# Patient Record
Sex: Female | Born: 1965 | Race: White | Hispanic: No | Smoking: Never smoker
Health system: Southern US, Community
[De-identification: ages and names within clinical notes are randomized; demographics above are authoritative.]

## PROBLEM LIST (undated history)

## (undated) DIAGNOSIS — K219 Gastro-esophageal reflux disease without esophagitis: Secondary | ICD-10-CM

## (undated) DIAGNOSIS — G4700 Insomnia, unspecified: Secondary | ICD-10-CM

## (undated) DIAGNOSIS — Z780 Asymptomatic menopausal state: Secondary | ICD-10-CM

## (undated) DIAGNOSIS — F419 Anxiety disorder, unspecified: Secondary | ICD-10-CM

## (undated) DIAGNOSIS — F329 Major depressive disorder, single episode, unspecified: Secondary | ICD-10-CM

## (undated) DIAGNOSIS — F32A Depression, unspecified: Secondary | ICD-10-CM

## (undated) DIAGNOSIS — I10 Essential (primary) hypertension: Secondary | ICD-10-CM

## (undated) DIAGNOSIS — E7800 Pure hypercholesterolemia, unspecified: Secondary | ICD-10-CM

## (undated) HISTORY — DX: Asymptomatic menopausal state: Z78.0

## (undated) HISTORY — PX: SHOULDER OPEN ROTATOR CUFF REPAIR: SHX2407

## (undated) HISTORY — PX: BREAST SURGERY: SHX581

## (undated) HISTORY — PX: OTHER SURGICAL HISTORY: SHX169

## (undated) HISTORY — PX: RHINOPLASTY: SUR1284

## (undated) HISTORY — DX: Essential (primary) hypertension: I10

## (undated) HISTORY — DX: Pure hypercholesterolemia, unspecified: E78.00

---

## 1996-10-23 HISTORY — PX: AUGMENTATION MAMMAPLASTY: SUR837

## 2016-10-02 DIAGNOSIS — Z760 Encounter for issue of repeat prescription: Secondary | ICD-10-CM | POA: Diagnosis not present

## 2016-10-21 ENCOUNTER — Encounter: Payer: Self-pay | Admitting: Emergency Medicine

## 2016-10-21 ENCOUNTER — Emergency Department
Admission: EM | Admit: 2016-10-21 | Discharge: 2016-10-21 | Disposition: A | Discharge status: Home / Self Care | Payer: 59 | Source: Home / Self Care | Attending: Family Medicine | Admitting: Family Medicine

## 2016-10-21 DIAGNOSIS — Z760 Encounter for issue of repeat prescription: Secondary | ICD-10-CM | POA: Diagnosis not present

## 2016-10-21 DIAGNOSIS — R69 Illness, unspecified: Secondary | ICD-10-CM | POA: Diagnosis not present

## 2016-10-21 DIAGNOSIS — R509 Fever, unspecified: Secondary | ICD-10-CM | POA: Diagnosis not present

## 2016-10-21 DIAGNOSIS — R05 Cough: Secondary | ICD-10-CM

## 2016-10-21 DIAGNOSIS — J111 Influenza due to unidentified influenza virus with other respiratory manifestations: Secondary | ICD-10-CM

## 2016-10-21 HISTORY — DX: Insomnia, unspecified: G47.00

## 2016-10-21 HISTORY — DX: Gastro-esophageal reflux disease without esophagitis: K21.9

## 2016-10-21 HISTORY — DX: Major depressive disorder, single episode, unspecified: F32.9

## 2016-10-21 HISTORY — DX: Depression, unspecified: F32.A

## 2016-10-21 HISTORY — DX: Anxiety disorder, unspecified: F41.9

## 2016-10-21 MED ORDER — PROMETHAZINE-CODEINE 6.25-10 MG/5ML PO SYRP: 5.0000 mL | ORAL_SOLUTION | Freq: Four times a day (QID) | ORAL | 0 refills | Status: DC | PRN

## 2016-10-21 MED ORDER — BENZONATATE 100 MG PO CAPS: 100.0000 mg | ORAL_CAPSULE | Freq: Three times a day (TID) | ORAL | 0 refills | Status: DC

## 2016-10-21 MED ORDER — OSELTAMIVIR PHOSPHATE 75 MG PO CAPS: 75.0000 mg | ORAL_CAPSULE | Freq: Two times a day (BID) | ORAL | 0 refills | Status: DC

## 2016-10-21 NOTE — ED Provider Notes (Signed)
CSN: [id number]     Arrival date & time 10/21/16  1010 History   First MD Initiated Contact with Patient 10/21/16 1058     Chief Complaint  Patient presents with  . Fever  . Cough  . Generalized Body Aches   (Consider location/radiation/quality/duration/timing/severity/associated sxs/prior Treatment) HPI  Sonya Munoz is a 50 y.o. female presenting to UC with c/o sudden onset cough for 2 days, then body aches, and fever that started last night.  Pt did get the flu vaccine this season as she works as a Marine scientist but notes she feels like she does have the flu as she has had it twice in the past.  Denies chest pain or SOB.  She has been taking tylenol and ibuprofen with mild temporary relief.  Denies n/v/d.  Pt notes she was around a sick relative this past weekend.   Past Medical History:  Diagnosis Date  . Anxiety and depression   . GERD (gastroesophageal reflux disease)   . Insomnia    Past Surgical History:  Procedure Laterality Date  . BREAST SURGERY Bilateral    augmentation  . fallopian tube repair    . RHINOPLASTY    . SHOULDER OPEN ROTATOR CUFF REPAIR Left    History reviewed. No pertinent family history. Social History  Substance Use Topics  . Smoking status: Never Smoker  . Smokeless tobacco: Never Used  . Alcohol use Yes   OB History    No data available     Review of Systems  Constitutional: Positive for fatigue and fever. Negative for chills.  HENT: Positive for congestion. Negative for ear pain, sore throat, trouble swallowing and voice change.   Respiratory: Positive for cough. Negative for shortness of breath.   Cardiovascular: Negative for chest pain and palpitations.  Gastrointestinal: Negative for abdominal pain, diarrhea, nausea and vomiting.  Musculoskeletal: Positive for arthralgias and myalgias. Negative for back pain.  Skin: Negative for rash.    Allergies  Patient has no known allergies.  Home Medications   Prior to Admission medications    Medication Sig Start Date End Date Taking? Authorizing Provider  estrogen, conjugated,-medroxyprogesterone (PREMPRO) 0.3-1.5 MG tablet Take 1 tablet by mouth daily.   Yes Historical Provider, MD  lisinopril (PRINIVIL,ZESTRIL) 10 MG tablet Take 10 mg by mouth daily.   Yes Historical Provider, MD  omeprazole (PRILOSEC) 20 MG capsule Take 20 mg by mouth daily.   Yes Historical Provider, MD  sertraline (ZOLOFT) 50 MG tablet Take 50 mg by mouth daily.   Yes Historical Provider, MD  traZODone (DESYREL) 25 mg TABS tablet Take 25 mg by mouth at bedtime.   Yes Historical Provider, MD  benzonatate (TESSALON) 100 MG capsule Take 1-2 capsules (100-200 mg total) by mouth every 8 (eight) hours. 10/21/16   Noland Fordyce, PA-C  oseltamivir (TAMIFLU) 75 MG capsule Take 1 capsule (75 mg total) by mouth every 12 (twelve) hours. 10/21/16   Noland Fordyce, PA-C  promethazine-codeine (PHENERGAN WITH CODEINE) 6.25-10 MG/5ML syrup Take 5 mLs by mouth every 6 (six) hours as needed for cough. 10/21/16   Noland Fordyce, PA-C   Meds Ordered and Administered this Visit  Medications - No data to display  BP 124/80 (BP Location: Left Arm)   Pulse 85   Temp 99.7 F (37.6 C) (Oral)   Resp 17   Ht 5\' 7"  (1.702 m)   Wt 145 lb (65.8 kg)   SpO2 99%   BMI 22.71 kg/m  No data found.   Physical Exam  Constitutional: She is oriented to person, place, and time. She appears well-developed and well-nourished. No distress.  HENT:  Head: Normocephalic and atraumatic.  Right Ear: Tympanic membrane normal.  Left Ear: Tympanic membrane normal.  Nose: Mucosal edema present.  Mouth/Throat: Uvula is midline and mucous membranes are normal. Posterior oropharyngeal erythema present. No oropharyngeal exudate, posterior oropharyngeal edema or tonsillar abscesses.  Eyes: EOM are normal.  Neck: Normal range of motion. Neck supple.  Cardiovascular: Normal rate and regular rhythm.   Pulmonary/Chest: Effort normal and breath sounds normal.  No stridor. No respiratory distress. She has no wheezes. She has no rales.  Musculoskeletal: Normal range of motion.  Lymphadenopathy:    She has no cervical adenopathy.  Neurological: She is alert and oriented to person, place, and time.  Skin: Skin is warm and dry. She is not diaphoretic.  Psychiatric: She has a normal mood and affect. Her behavior is normal.  Nursing note and vitals reviewed.   Urgent Care Course   Clinical Course     Procedures (including critical care time)  Labs Review Labs Reviewed - No data to display  Imaging Review No results found.   MDM   1. Influenza-like illness    Pt c/o flu-like symptoms for 2 days.  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 Pt requesting Tamiflu and tessalon and cough medication to help her sleep at night  Rx: Tamiflu, tessalon, and promethazine-codeine (advised not to drink alcohol or drive while taking)  Advised pt to use acetaminophen and ibuprofen as needed for fever and pain. Encouraged rest and fluids. F/u with PCP in 7-10 days if not improving, sooner if worsening. Pt verbalized understanding and agreement with tx plan.    Noland Fordyce, PA-C 10/21/16 1350

## 2016-10-21 NOTE — ED Triage Notes (Signed)
Patient started with cough 2 days ago; last night aches and fever.

## 2016-10-31 MED FILL — SERTRALINE HCL 50 MG TABLET: 50 | 30 days supply | Qty: 30 | Fill #0

## 2016-10-31 MED FILL — PREMPRO 0.3 MG-1.5 MG TAB: 0.3-1.5 | 28 days supply | Qty: 28 | Fill #0

## 2016-10-31 MED FILL — traZODone HCL 50 MG TABS: 50 | 30 days supply | Qty: 30 | Fill #0

## 2016-11-03 DIAGNOSIS — I10 Essential (primary) hypertension: Secondary | ICD-10-CM | POA: Diagnosis not present

## 2016-11-03 DIAGNOSIS — N951 Menopausal and female climacteric states: Secondary | ICD-10-CM | POA: Diagnosis not present

## 2016-11-03 DIAGNOSIS — G4709 Other insomnia: Secondary | ICD-10-CM | POA: Diagnosis not present

## 2016-11-03 DIAGNOSIS — F418 Other specified anxiety disorders: Secondary | ICD-10-CM | POA: Diagnosis not present

## 2016-11-03 DIAGNOSIS — K219 Gastro-esophageal reflux disease without esophagitis: Secondary | ICD-10-CM | POA: Diagnosis not present

## 2016-11-03 DIAGNOSIS — L308 Other specified dermatitis: Secondary | ICD-10-CM | POA: Diagnosis not present

## 2016-11-21 MED FILL — LISINOPRIL 10 MG TABLET: 10 | 90 days supply | Qty: 90 | Fill #0

## 2016-11-22 MED FILL — PREMPRO 0.3 MG-1.5 MG TAB: 0.3-1.5 | 28 days supply | Qty: 28 | Fill #0

## 2016-12-04 MED FILL — SERTRALINE HCL 50 MG TABLET: 50 | 30 days supply | Qty: 30 | Fill #1

## 2016-12-25 MED FILL — PREMPRO 0.3 MG-1.5 MG TAB: 0.3-1.5 | 28 days supply | Qty: 28 | Fill #1

## 2016-12-25 MED FILL — FLUOCINONIDE 0.05% SOLUTION: 0.05 | 30 days supply | Qty: 60 | Fill #0

## 2017-01-04 MED FILL — SERTRALINE HCL 50 MG TABLET: 50 | 30 days supply | Qty: 30 | Fill #2

## 2017-01-08 MED FILL — traZODone HCL 50 MG TABS: 50 | 90 days supply | Qty: 90 | Fill #0

## 2017-01-22 MED FILL — PREMPRO 0.3 MG-1.5 MG TAB: 0.3-1.5 | 28 days supply | Qty: 28 | Fill #2

## 2017-02-07 MED FILL — SERTRALINE HCL 50 MG TABLET: 50 | 30 days supply | Qty: 30 | Fill #3

## 2017-02-19 MED FILL — PREMPRO 0.3 MG-1.5 MG TAB: 0.3-1.5 | 28 days supply | Qty: 28 | Fill #3

## 2017-02-19 MED FILL — LISINOPRIL 10 MG TABLET: 10 | 90 days supply | Qty: 90 | Fill #1

## 2017-03-05 MED FILL — SERTRALINE HCL 50 MG TABLET: 50 | 30 days supply | Qty: 30 | Fill #4

## 2017-03-15 MED FILL — PREMPRO 0.3 MG-1.5 MG TAB: 0.3-1.5 | 28 days supply | Qty: 28 | Fill #4

## 2017-04-08 MED FILL — SERTRALINE HCL 50 MG TABLET: 50 | 30 days supply | Qty: 30 | Fill #5

## 2017-04-15 MED FILL — traZODone HCL 50 MG TABS: 50 | 90 days supply | Qty: 90 | Fill #1

## 2017-04-15 MED FILL — PREMPRO 0.3 MG-1.5 MG TAB: 0.3-1.5 | 28 days supply | Qty: 28 | Fill #5

## 2017-05-08 DIAGNOSIS — K219 Gastro-esophageal reflux disease without esophagitis: Secondary | ICD-10-CM | POA: Diagnosis not present

## 2017-05-08 DIAGNOSIS — G4709 Other insomnia: Secondary | ICD-10-CM | POA: Diagnosis not present

## 2017-05-08 DIAGNOSIS — I10 Essential (primary) hypertension: Secondary | ICD-10-CM | POA: Diagnosis not present

## 2017-05-08 DIAGNOSIS — N951 Menopausal and female climacteric states: Secondary | ICD-10-CM | POA: Diagnosis not present

## 2017-05-08 DIAGNOSIS — Z0000 Encounter for general adult medical examination without abnormal findings: Secondary | ICD-10-CM | POA: Diagnosis not present

## 2017-05-08 DIAGNOSIS — Z1211 Encounter for screening for malignant neoplasm of colon: Secondary | ICD-10-CM | POA: Diagnosis not present

## 2017-05-08 DIAGNOSIS — L308 Other specified dermatitis: Secondary | ICD-10-CM | POA: Diagnosis not present

## 2017-05-08 DIAGNOSIS — F418 Other specified anxiety disorders: Secondary | ICD-10-CM | POA: Diagnosis not present

## 2017-05-08 MED FILL — LISINOPRIL 10 MG TABLET: 10 | 90 days supply | Qty: 90 | Fill #0

## 2017-05-11 MED FILL — SERTRALINE HCL 50 MG TABLET: 50 | 30 days supply | Qty: 30 | Fill #6

## 2017-05-11 MED FILL — PREMPRO 0.3 MG-1.5 MG TAB: 0.3-1.5 | 28 days supply | Qty: 28 | Fill #6

## 2017-06-08 MED FILL — SERTRALINE HCL 50 MG TABLET: 50 | 30 days supply | Qty: 30 | Fill #7

## 2017-06-08 MED FILL — PREMPRO 0.3 MG-1.5 MG TAB: 0.3-1.5 | 28 days supply | Qty: 28 | Fill #7

## 2017-07-09 MED FILL — SERTRALINE HCL 50 MG TABLET: 50 | 90 days supply | Qty: 90 | Fill #0

## 2017-07-09 MED FILL — PREMPRO 0.3 MG-1.5 MG TAB: 0.3-1.5 | 28 days supply | Qty: 28 | Fill #8

## 2017-08-05 MED FILL — PREMPRO 0.3 MG-1.5 MG TAB: 0.3-1.5 | 28 days supply | Qty: 28 | Fill #9

## 2017-08-06 MED FILL — LISINOPRIL 10 MG TABS: 10 | 90 days supply | Qty: 90 | Fill #1

## 2017-08-16 MED FILL — traZODone HCL 50 MG TABS: 50 | 90 days supply | Qty: 90 | Fill #0

## 2017-09-01 MED FILL — PREMPRO 0.3 MG-1.5 MG TAB: 0.3-1.5 | 28 days supply | Qty: 28 | Fill #10

## 2017-09-11 ENCOUNTER — Other Ambulatory Visit: Payer: Self-pay | Admitting: Family Medicine

## 2017-09-11 DIAGNOSIS — Z139 Encounter for screening, unspecified: Secondary | ICD-10-CM

## 2017-09-30 MED FILL — FLUOCINONIDE 0.05% SOLUTION: 0.05 | 30 days supply | Qty: 60 | Fill #1

## 2017-09-30 MED FILL — PREMPRO 0.3 MG-1.5 MG TAB: 0.3-1.5 | 28 days supply | Qty: 28 | Fill #11

## 2017-10-09 MED FILL — SERTRALINE HCL 50 MG TABLET: 50 | 90 days supply | Qty: 90 | Fill #0

## 2017-10-11 ENCOUNTER — Ambulatory Visit
Admission: RE | Admit: 2017-10-11 | Discharge: 2017-10-11 | Disposition: A | Discharge status: Home / Self Care | Payer: 59 | Source: Ambulatory Visit | Attending: Family Medicine | Admitting: Family Medicine

## 2017-10-11 DIAGNOSIS — Z1231 Encounter for screening mammogram for malignant neoplasm of breast: Secondary | ICD-10-CM | POA: Diagnosis not present

## 2017-10-11 DIAGNOSIS — Z139 Encounter for screening, unspecified: Secondary | ICD-10-CM

## 2017-10-18 DIAGNOSIS — Z13220 Encounter for screening for lipoid disorders: Secondary | ICD-10-CM | POA: Diagnosis not present

## 2017-10-18 DIAGNOSIS — I10 Essential (primary) hypertension: Secondary | ICD-10-CM | POA: Diagnosis not present

## 2017-10-22 MED FILL — PREMPRO 0.3 MG-1.5 MG TAB: 0.3-1.5 | 84 days supply | Qty: 84 | Fill #0

## 2017-11-13 MED FILL — LISINOPRIL 10 MG TABS: 10 | 90 days supply | Qty: 90 | Fill #0

## 2017-11-22 DIAGNOSIS — F418 Other specified anxiety disorders: Secondary | ICD-10-CM | POA: Diagnosis not present

## 2017-11-22 DIAGNOSIS — I10 Essential (primary) hypertension: Secondary | ICD-10-CM | POA: Diagnosis not present

## 2017-11-22 DIAGNOSIS — G4709 Other insomnia: Secondary | ICD-10-CM | POA: Diagnosis not present

## 2017-11-22 DIAGNOSIS — N951 Menopausal and female climacteric states: Secondary | ICD-10-CM | POA: Diagnosis not present

## 2017-12-20 ENCOUNTER — Ambulatory Visit: Payer: 59 | Admitting: Family Medicine

## 2017-12-27 ENCOUNTER — Ambulatory Visit: Payer: Self-pay

## 2017-12-27 ENCOUNTER — Encounter: Payer: Self-pay | Admitting: Family Medicine

## 2017-12-27 ENCOUNTER — Ambulatory Visit (INDEPENDENT_AMBULATORY_CARE_PROVIDER_SITE_OTHER): Payer: 59 | Admitting: Family Medicine

## 2017-12-27 VITALS — BP 132/74 | Ht 67.0 in | Wt 139.0 lb

## 2017-12-27 DIAGNOSIS — M25511 Pain in right shoulder: Secondary | ICD-10-CM | POA: Insufficient documentation

## 2017-12-27 NOTE — Patient Instructions (Signed)
You have impingement of your infraspinatus with overlying bursitis. Avoid full external rotation, overhead presses, incline bench, full motion on flys. Avoid other painful activities as well. Aleve 2 tabs twice a day with food OR ibuprofen 3 tabs three times a day with food for pain and inflammation - typically for 7-10 days then as needed. Can take tylenol in addition to this if needed. Injection may be beneficial to help with pain and to decrease inflammation if struggling. Consider physical therapy with transition to home exercise program. Do home exercise program with theraband and scapular stabilization exercises daily 3 sets of 10 once a day. If not improving at follow-up we will consider injection, physical therapy, and/or nitro patches. Follow up with me in 6 weeks for reevaluation.

## 2017-12-27 NOTE — Assessment & Plan Note (Addendum)
Acute.  Signs of infraspinatus bursitis based on ultrasound.  Likely from repetitive use.  Difficult to localize tenderness based on exam.  No signs of rotator cuff though MRI would be optimal for labral tear evaluation.  - Recommending rotator cuff strengthening, Aleve BID PRN for pain - RTC 6 weeks

## 2017-12-27 NOTE — Progress Notes (Signed)
Subjective   Patient ID: Sonya Munoz    DOB: [birth year], 52 y.o. female   MRN: [id number]  CC: "Right shoulder pain"  HPI: Sonya Munoz is a 52 y.o. female who presents to clinic today for the following:  Right shoulder pain: Patient is a right-handed weightlifter presenting with right shoulder pain over the last 6 weeks.  She states her pain is worse with pulling and she has avoided bench pressing due to exacerbation of her symptoms.  She endorses sleeping on her right side and sometimes has pain when waking up however this improves during the day unless she exercises.  She was also playing football with her nephew and endorsed exacerbation of the pain.  She denies sensory loss or decreased motor function.  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  She is unsure if this knee pain is consistent with her prior labral tear.  She continues to lift but avoids exercises which exacerbate her symptoms and has tried Advil twice daily for [redacted] week along with rest ice and heat.  ROS: see HPI for pertinent.  [place]: Depression and anxiety, GERD, insomnia.  Surgical history left rotator cuff repair.  Family history unremarkable. Smoking status reviewed. Medications reviewed.  Objective   BP 132/74   Ht 5\' 7"  (1.702 m)   Wt 139 lb (63 kg)   BMI 21.77 kg/m  Vitals and nursing note reviewed.  General: well nourished, well developed, NAD with non-toxic appearance HEENT: normocephalic, atraumatic, moist mucous membranes Cardiovascular: regular rate and rhythm without murmurs, rubs, or gallops Lungs: clear to auscultation bilaterally with normal work of breathing Skin: warm, dry, no rashes or lesions, cap refill < 2 seconds Extremities: warm and well perfused, normal tone, no edema MSK: Symmetrical shoulders without atrophy, nontender on AC,  mild tenderness with proximal biceps and deltoid, positive apprehension test, negative Yergason and empty can test, active and passive range of motion intact, neurovascular intact  Complete ultrasound of right rotator cuff: Biceps intact without signs of impingement.  AC joint with arthritic changes.  Infraspinatus bursa with increased fluid.  Supra spinatus, subscapular, and teres minor unremarkable.  Posterior labrum visualized without signs of labral tear.  Assessment & Plan   Right shoulder pain Acute.  Signs of infraspinatus bursitis based on ultrasound.  Likely from repetitive use.  Difficult to localize tenderness based on exam.  No signs of rotator cuff though MRI would be optimal for labral tear evaluation.  - Recommending rotator cuff strengthening, Aleve BID PRN for pain - RTC 6 weeks  Orders Placed This Encounter  Procedures  . [place] COMPLETE JOINT SPACE STRUCTURES UP RIGHT    Standing Status:   Future    Number of Occurrences:   1    Standing Expiration Date:   02/26/2019    Order Specific Question:   Reason for Exam (SYMPTOM  OR DIAGNOSIS REQUIRED)    Answer:   RIGHT SHOULDER PAIN    Order Specific Question:   Preferred imaging location?    Answer:   Internal   No orders of the defined types were placed in this encounter.   Harriet [place], [place], PGY-2 12/27/2017, 5:06 PM   Addendum:  Patient seen with resident and agree with his note.  Patient noted to have pain anterior and lateral shoulder worse with external rotation on exam.  Ultrasound showed no evidence of rotator cuff tear but bursitis overlying infraspinatus and impingement.  Discussed how to modify her workout.  Aleve or ibuprofen regularly.  Shown home exercise program.  Consider injection, PT, nitro patches if not improving.  She will follow up in 6 weeks.

## 2018-01-04 MED FILL — traZODone HCL 50 MG TABS: 50 | 90 days supply | Qty: 90 | Fill #0

## 2018-01-04 MED FILL — SERTRALINE HCL 50 MG TABLET: 50 | 90 days supply | Qty: 90 | Fill #0

## 2018-01-22 MED FILL — PREMPRO 0.3 MG-1.5 MG TAB: 0.3-1.5 | 28 days supply | Qty: 28 | Fill #1

## 2018-02-11 ENCOUNTER — Ambulatory Visit: Payer: 59 | Admitting: Family Medicine

## 2018-02-18 MED FILL — PREMPRO 0.3 MG-1.5 MG TAB: 0.3-1.5 | 28 days supply | Qty: 28 | Fill #2

## 2018-02-18 MED FILL — LISINOPRIL 10 MG TABS: 10 | 90 days supply | Qty: 90 | Fill #0

## 2018-03-19 MED FILL — PREMPRO 0.3 MG-1.5 MG TAB: 0.3-1.5 | 28 days supply | Qty: 28 | Fill #3

## 2018-03-29 ENCOUNTER — Telehealth: Payer: 59 | Admitting: Family

## 2018-03-29 DIAGNOSIS — B9689 Other specified bacterial agents as the cause of diseases classified elsewhere: Secondary | ICD-10-CM

## 2018-03-29 DIAGNOSIS — J329 Chronic sinusitis, unspecified: Secondary | ICD-10-CM | POA: Diagnosis not present

## 2018-03-29 MED ORDER — BENZONATATE 100 MG PO CAPS: 100.0000 mg | ORAL_CAPSULE | Freq: Three times a day (TID) | ORAL | 0 refills | Status: DC | PRN

## 2018-03-29 MED ORDER — AMOXICILLIN-POT CLAVULANATE 875-125 MG PO TABS: 1.0000 | ORAL_TABLET | Freq: Two times a day (BID) | ORAL | 0 refills | Status: AC

## 2018-03-29 MED FILL — BENZONATATE 100 MG CAP: 100 | 5 days supply | Qty: 30 | Fill #0

## 2018-03-29 MED FILL — AMOX TR-K CLV 875-125 MG TA: 875-125 | 7 days supply | Qty: 14 | Fill #0

## 2018-03-29 NOTE — Progress Notes (Signed)
Thank you for the details you included in the comment boxes. Those details are very helpful in determining the best course of treatment for you and help [place] to provide the best care. See plan below. Also adding Tessalon Perles 100mg , take 1-2 caps every 8 hours for cough as needed.   We are sorry that you are not feeling well.  Here is how we plan to help!  Based on what you have shared with me it looks like you have sinusitis.  Sinusitis is inflammation and infection in the sinus cavities of the head.  Based on your presentation I believe you most likely have Acute Bacterial Sinusitis.  This is an infection caused by bacteria and is treated with antibiotics. I have prescribed Augmentin 875mg /125mg  one tablet twice daily with food, for 7 days. You may use an oral decongestant such as Mucinex D or if you have glaucoma or high blood pressure use plain Mucinex. Saline nasal spray help and can safely be used as often as needed for congestion.  If you develop worsening sinus pain, fever or notice severe headache and vision changes, or if symptoms are not better after completion of antibiotic, please schedule an appointment with a health care provider.    Sinus infections are not as easily transmitted as other respiratory infection, however we still recommend that you avoid close contact with loved ones, especially the very young and elderly.  Remember to wash your hands thoroughly throughout the day as this is the number one way to prevent the spread of infection!  Home Care:  Only take medications as instructed by your medical team.  Complete the entire course of an antibiotic.  Do not take these medications with alcohol.  A steam or ultrasonic humidifier can help congestion.  You can place a towel over your head and breathe in the steam from hot water coming from a faucet.  Avoid close contacts especially the very young and the elderly.  Cover your mouth when you cough or sneeze.  Always remember  to wash your hands.  Get Help Right Away If:  You develop worsening fever or sinus pain.  You develop a severe head ache or visual changes.  Your symptoms persist after you have completed your treatment plan.  Make sure you  Understand these instructions.  Will watch your condition.  Will get help right away if you are not doing well or get worse.  Your e-visit answers were reviewed by a board certified advanced clinical practitioner to complete your personal care plan.  Depending on the condition, your plan could have included both over the counter or prescription medications.  If there is a problem please reply  once you have received a response from your provider.  Your safety is important to [place].  If you have drug allergies check your prescription carefully.    You can use MyChart to ask questions about today's visit, request a non-urgent call back, or ask for a work or school excuse for 24 hours related to this e-Visit. If it has been greater than 24 hours you will need to follow up with your provider, or enter a new e-Visit to address those concerns.  You will get an e-mail in the next two days asking about your experience.  I hope that your e-visit has been valuable and will speed your recovery. Thank you for using e-visits.

## 2018-04-12 MED FILL — SERTRALINE HCL 50 MG TABLET: 50 | 90 days supply | Qty: 90 | Fill #1

## 2018-04-15 MED FILL — PREMPRO 0.3 MG-1.5 MG TAB: 0.3-1.5 | 28 days supply | Qty: 28 | Fill #0

## 2018-05-14 MED FILL — LISINOPRIL 10 MG TABLET: 10 | 90 days supply | Qty: 90 | Fill #1

## 2018-05-14 MED FILL — PREMPRO 0.3 MG-1.5 MG TAB: 0.3-1.5 | 56 days supply | Qty: 56 | Fill #1

## 2018-05-16 DIAGNOSIS — I10 Essential (primary) hypertension: Secondary | ICD-10-CM | POA: Diagnosis not present

## 2018-05-16 DIAGNOSIS — Z1211 Encounter for screening for malignant neoplasm of colon: Secondary | ICD-10-CM | POA: Diagnosis not present

## 2018-05-16 DIAGNOSIS — D229 Melanocytic nevi, unspecified: Secondary | ICD-10-CM | POA: Diagnosis not present

## 2018-05-16 DIAGNOSIS — Z0000 Encounter for general adult medical examination without abnormal findings: Secondary | ICD-10-CM | POA: Diagnosis not present

## 2018-05-16 DIAGNOSIS — R739 Hyperglycemia, unspecified: Secondary | ICD-10-CM | POA: Diagnosis not present

## 2018-05-16 DIAGNOSIS — N951 Menopausal and female climacteric states: Secondary | ICD-10-CM | POA: Diagnosis not present

## 2018-05-16 DIAGNOSIS — G4709 Other insomnia: Secondary | ICD-10-CM | POA: Diagnosis not present

## 2018-05-16 DIAGNOSIS — F418 Other specified anxiety disorders: Secondary | ICD-10-CM | POA: Diagnosis not present

## 2018-05-16 MED FILL — traZODone HCL 50 MG TABS: 50 | 90 days supply | Qty: 90 | Fill #0

## 2018-07-05 DIAGNOSIS — H5213 Myopia, bilateral: Secondary | ICD-10-CM | POA: Diagnosis not present

## 2018-07-10 MED FILL — SERTRALINE HCL 50 MG TABLET: 50 | 90 days supply | Qty: 90 | Fill #0

## 2018-07-10 MED FILL — PREMPRO 0.3 MG-1.5 MG TAB: 0.3-1.5 | 84 days supply | Qty: 84 | Fill #0

## 2018-08-26 MED FILL — LISINOPRIL 10 MG TABS: 10 | 90 days supply | Qty: 90 | Fill #0

## 2018-09-05 ENCOUNTER — Encounter: Payer: Self-pay | Admitting: Family Medicine

## 2018-09-05 ENCOUNTER — Ambulatory Visit: Payer: Self-pay | Admitting: Family Medicine

## 2018-09-05 VITALS — BP 122/66 | HR 72 | Temp 98.8°F | Wt 143.2 lb

## 2018-09-05 DIAGNOSIS — L600 Ingrowing nail: Secondary | ICD-10-CM

## 2018-09-05 MED ORDER — SULFAMETHOXAZOLE-TRIMETHOPRIM 400-80 MG PO TABS
1.0000 | ORAL_TABLET | Freq: Two times a day (BID) | ORAL | 0 refills | Status: AC
Start: 2018-09-05 — End: 2018-09-10

## 2018-09-05 MED ORDER — MUPIROCIN 2 % EX OINT: 1.0000 "application " | TOPICAL_OINTMENT | Freq: Two times a day (BID) | CUTANEOUS | 0 refills | Status: DC

## 2018-09-05 MED ORDER — TRIAMCINOLONE ACETONIDE 0.1 % EX CREA: 1.0000 "application " | TOPICAL_CREAM | Freq: Two times a day (BID) | CUTANEOUS | 0 refills | Status: DC

## 2018-09-05 MED FILL — TRIAMCINOLONE 0.1% CREAM: 0.1 | 15 days supply | Qty: 30 | Fill #0

## 2018-09-05 MED FILL — MUPIROCIN 2% OINTMENT: 2 | 7 days supply | Qty: 22 | Fill #0

## 2018-09-05 MED FILL — SULFAMETHOXAZOLE-TMP SS TAB: 400-80 | 5 days supply | Qty: 10 | Fill #0

## 2018-09-05 NOTE — Patient Instructions (Addendum)
Ingrown Toenail An ingrown toenail occurs when the corner or sides of your toenail grow into the surrounding skin. The big toe is most commonly affected, but it can happen to any of your toes. If your ingrown toenail is not treated, you will be at risk for infection. What are the causes? This condition may be caused by:  Wearing shoes that are too small or tight.  Injury or trauma, such as stubbing your toe or having your toe stepped on.  Improper cutting or care of your toenails.  Being born with (congenital) nail or foot abnormalities, such as having a nail that is too big for your toe.  What increases the risk? Risk factors for an ingrown toenail include:  Age. Your nails tend to thicken as you get older, so ingrown nails are more common in older people.  Diabetes.  Cutting your toenails incorrectly.  Blood circulation problems.  What are the signs or symptoms? Symptoms may include:  Pain, soreness, or tenderness.  Redness.  Swelling.  Hardening of the skin surrounding the toe.  Your ingrown toenail may be infected if there is fluid, pus, or drainage. How is this diagnosed? An ingrown toenail may be diagnosed by medical history and physical exam. If your toenail is infected, your health care provider may test a sample of the drainage. How is this treated? Treatment depends on the severity of your ingrown toenail. Some ingrown toenails may be treated at home. More severe or infected ingrown toenails may require surgery to remove all or part of the nail. Infected ingrown toenails may also be treated with antibiotic medicines. Follow these instructions at home:  If you were prescribed an antibiotic medicine, finish all of it even if you start to feel better.  Soak your foot in warm soapy water for 20 minutes, 3 times per day or as directed by your health care provider.  Carefully lift the edge of the nail away from the sore skin by wedging a small piece of cotton under  the corner of the nail. This may help with the pain. Be careful not to cause more injury to the area.  Wear shoes that fit well. If your ingrown toenail is causing you pain, try wearing sandals, if possible.  Trim your toenails regularly and carefully. Do not cut them in a curved shape. Cut your toenails straight across. This prevents injury to the skin at the corners of the toenail.  Keep your feet clean and dry.  If you are having trouble walking and are given crutches by your health care provider, use them as directed.  Do not pick at your toenail or try to remove it yourself.  Take medicines only as directed by your health care provider.  Keep all follow-up visits as directed by your health care provider. This is important. Contact a health care provider if:  Your symptoms do not improve with treatment. Get help right away if:  You have red streaks that start at your foot and go up your leg.  You have a fever.  You have increased redness, swelling, or pain.  You have fluid, blood, or pus coming from your toenail. This information is not intended to replace advice given to you by your health care provider. Make sure you discuss any questions you have with your health care provider. Document Released: 10/06/2000 Document Revised: 03/10/2016 Document Reviewed: 09/02/2014 Elsevier Interactive Patient Education  2018 [place].  Soaking the affected foot in warm, soapy water for 10 to 20  minutes three times per day for one to two weeks, pushing the lateral nail fold away from the nail plate [5]. Alternatively, a solution of water mixed with 1 to 2 teaspoons of Epsom salts can be used [6]. A medium- to high-potency topical corticosteroid can be applied after soaking to reduce inflammation (table 1).   Soaking the affected foot in warm, soapy water for 10 to 20 minutes three times per day for one to two weeks, pushing the lateral nail fold away from the nail plate [5].  Alternatively, a solution of water mixed with 1 to 2 teaspoons of Epsom salts can be used [6]. A medium- to high-potency topical corticosteroid can be applied after soaking to reduce inflammation (table 1).

## 2018-09-05 NOTE — Progress Notes (Signed)
Sonya Munoz is a 52 y.o. female who presents today with concerns of right toe pain. She reports a history of ingrown toenail. She has a history of these she reports many years ago but has not had an exacerbation for many years. She believes that previous issues resolved spontaneously without intervention or treatment. To date she has attempted to use warm epsom salt soaks with only minimal relief. She has become concerned now that she is preparing to go out of town for a conference.  Review of Systems  Constitutional: Negative for chills, fever and malaise/fatigue.  HENT: Negative for congestion, ear discharge, ear pain, sinus pain and sore throat.   Eyes: Negative.   Respiratory: Negative for cough, sputum production and shortness of breath.   Cardiovascular: Negative.  Negative for chest pain.  Gastrointestinal: Negative for abdominal pain, diarrhea, nausea and vomiting.  Genitourinary: Negative for dysuria, frequency, hematuria and urgency.  Musculoskeletal: Negative for myalgias.  Skin: Negative.        Right big toe pain and redness  Neurological: Negative for headaches.  Endo/Heme/Allergies: Negative.   Psychiatric/Behavioral: Negative.     O: Vitals:   09/05/18 0812  BP: 122/66  Pulse: 72  Temp: 98.8 F (37.1 C)  SpO2: 99%     Physical Exam  Constitutional: She is oriented to person, place, and time. Vital signs are normal. She appears well-developed and well-nourished. She is active.  Non-toxic appearance. She does not have a sickly appearance.  HENT:  Head: Normocephalic.  Right Ear: Hearing, tympanic membrane, external ear and ear canal normal.  Left Ear: Hearing, tympanic membrane, external ear and ear canal normal.  Nose: Nose normal.  Mouth/Throat: Uvula is midline and oropharynx is clear and moist.  Neck: Normal range of motion. Neck supple.  Cardiovascular: Normal rate, regular rhythm, normal heart sounds and normal pulses.  Pulmonary/Chest: Effort normal and  breath sounds normal.  Abdominal: Soft. Bowel sounds are normal.  Musculoskeletal: Normal range of motion.  Feet:  Right Foot:  Skin Integrity: Positive for erythema and warmth.  Lymphadenopathy:       Head (right side): No submental and no submandibular adenopathy present.       Head (left side): No submental and no submandibular adenopathy present.    She has no cervical adenopathy.  Neurological: She is alert and oriented to person, place, and time.  Skin: No rash noted. There is erythema. No cyanosis. Nails show no clubbing.  Medial edge of great toe erythremic and tender to touch. Only mild over growth of toenail pressing into skin can be easily lifted by provider.  Psychiatric: She has a normal mood and affect.  Vitals reviewed.  A: 1. Ingrown nail of great toe of right foot    P: Discussed exam findings, diagnosis etiology and medication use and indications reviewed with patient. Follow- Up and discharge instructions provided. No emergent/urgent issues found on exam.  Patient verbalized understanding of information provided and agrees with plan of care (POC), all questions answered.  1. Ingrown nail of great toe of right foot - sulfamethoxazole-trimethoprim (BACTRIM) 400-80 MG tablet; Take 1 tablet by mouth 2 (two) times daily for 5 days. - triamcinolone cream (KENALOG) 0.1 %; Apply 1 application topically 2 (two) times daily. - mupirocin ointment (BACTROBAN) 2 %; Place 1 application into the nose 2 (two) times daily.  Discussed Up to Date- patient instructions soaking methods.  Soaking the affected foot in warm, soapy water for 10 to 20 minutes three times per day for  one to two weeks, pushing the lateral nail fold away from the nail plate [5]. Alternatively, a solution of water mixed with 1 to 2 teaspoons of Epsom salts can be used [6]. A medium- to high-potency topical corticosteroid can be applied after soaking to reduce inflammation (table 1).   Soaking the affected foot  in warm, soapy water for 10 to 20 minutes three times per day for one to two weeks, pushing the lateral nail fold away from the nail plate [5]. Alternatively, a solution of water mixed with 1 to 2 teaspoons of Epsom salts can be used [6]. A medium- to high-potency topical corticosteroid can be applied after soaking to reduce inflammation (table 1).

## 2018-10-01 MED FILL — traZODone HCL 50 MG TABS: 50 | 90 days supply | Qty: 90 | Fill #1

## 2018-10-01 MED FILL — PREMPRO 0.3 MG-1.5 MG TAB: 0.3-1.5 | 84 days supply | Qty: 84 | Fill #1

## 2018-10-01 MED FILL — SERTRALINE HCL 50 MG TABLET: 50 | 90 days supply | Qty: 90 | Fill #1

## 2018-10-08 ENCOUNTER — Other Ambulatory Visit (HOSPITAL_BASED_OUTPATIENT_CLINIC_OR_DEPARTMENT_OTHER): Payer: Self-pay | Admitting: Family Medicine

## 2018-10-08 ENCOUNTER — Inpatient Hospital Stay (HOSPITAL_BASED_OUTPATIENT_CLINIC_OR_DEPARTMENT_OTHER): Admission: RE | Admit: 2018-10-08 | Payer: 59 | Source: Ambulatory Visit

## 2018-10-08 DIAGNOSIS — Z1231 Encounter for screening mammogram for malignant neoplasm of breast: Secondary | ICD-10-CM

## 2018-10-17 ENCOUNTER — Ambulatory Visit (HOSPITAL_BASED_OUTPATIENT_CLINIC_OR_DEPARTMENT_OTHER)
Admission: RE | Admit: 2018-10-17 | Discharge: 2018-10-17 | Disposition: A | Discharge status: Home / Self Care | Payer: 59 | Source: Ambulatory Visit | Attending: Family Medicine | Admitting: Family Medicine

## 2018-10-17 DIAGNOSIS — Z1231 Encounter for screening mammogram for malignant neoplasm of breast: Secondary | ICD-10-CM | POA: Diagnosis not present

## 2018-11-25 MED FILL — LISINOPRIL 10 MG TABLET: 10 | 90 days supply | Qty: 90 | Fill #1

## 2018-12-03 DIAGNOSIS — F418 Other specified anxiety disorders: Secondary | ICD-10-CM | POA: Diagnosis not present

## 2018-12-03 DIAGNOSIS — G4709 Other insomnia: Secondary | ICD-10-CM | POA: Diagnosis not present

## 2018-12-03 DIAGNOSIS — I10 Essential (primary) hypertension: Secondary | ICD-10-CM | POA: Diagnosis not present

## 2018-12-03 DIAGNOSIS — R739 Hyperglycemia, unspecified: Secondary | ICD-10-CM | POA: Diagnosis not present

## 2018-12-03 DIAGNOSIS — N951 Menopausal and female climacteric states: Secondary | ICD-10-CM | POA: Diagnosis not present

## 2018-12-28 MED FILL — PREMPRO 0.3 MG-1.5 MG TAB: 0.3-1.5 | 28 days supply | Qty: 28 | Fill #2

## 2018-12-30 MED FILL — SERTRALINE HCL 50 MG TABLET: 50 | 90 days supply | Qty: 90 | Fill #0

## 2019-01-18 MED FILL — LISINOPRIL 10 MG TABLET: 10 | 90 days supply | Qty: 90 | Fill #0

## 2019-01-18 MED FILL — traZODone HCL 50 MG TABS: 50 | 90 days supply | Qty: 90 | Fill #0

## 2019-02-24 MED FILL — PREMPRO 0.3 MG-1.5 MG TAB: 0.3-1.5 | 28 days supply | Qty: 28 | Fill #3

## 2019-03-29 MED FILL — PREMPRO 0.3 MG-1.5 MG TAB: 0.3-1.5 | 28 days supply | Qty: 28 | Fill #4

## 2019-03-29 MED FILL — SERTRALINE HCL 50 MG TABS: 50 | 90 days supply | Qty: 90 | Fill #1

## 2019-04-12 MED FILL — LISINOPRIL 10 MG TABLET: 10 | 90 days supply | Qty: 90 | Fill #1

## 2019-04-28 MED FILL — PREMPRO 0.3 MG-1.5 MG TAB: 0.3-1.5 | 28 days supply | Qty: 28 | Fill #5

## 2019-05-12 ENCOUNTER — Other Ambulatory Visit: Payer: Self-pay

## 2019-05-12 DIAGNOSIS — Z20828 Contact with and (suspected) exposure to other viral communicable diseases: Secondary | ICD-10-CM

## 2019-05-12 DIAGNOSIS — Z20822 Contact with and (suspected) exposure to covid-19: Secondary | ICD-10-CM

## 2019-05-15 LAB — NOVEL CORONAVIRUS, NAA: SARS-CoV-2, NAA: NOT DETECTED

## 2019-05-15 LAB — SPECIMEN STATUS REPORT

## 2019-05-24 MED FILL — PREMPRO 0.3 MG-1.5 MG TAB: 0.3-1.5 | 28 days supply | Qty: 28 | Fill #0

## 2019-06-16 DIAGNOSIS — F418 Other specified anxiety disorders: Secondary | ICD-10-CM | POA: Diagnosis not present

## 2019-06-16 DIAGNOSIS — N951 Menopausal and female climacteric states: Secondary | ICD-10-CM | POA: Diagnosis not present

## 2019-06-16 DIAGNOSIS — G4709 Other insomnia: Secondary | ICD-10-CM | POA: Diagnosis not present

## 2019-06-16 DIAGNOSIS — R739 Hyperglycemia, unspecified: Secondary | ICD-10-CM | POA: Diagnosis not present

## 2019-06-16 DIAGNOSIS — I10 Essential (primary) hypertension: Secondary | ICD-10-CM | POA: Diagnosis not present

## 2019-06-16 DIAGNOSIS — S46212A Strain of muscle, fascia and tendon of other parts of biceps, left arm, initial encounter: Secondary | ICD-10-CM | POA: Diagnosis not present

## 2019-06-16 MED FILL — SERTRALINE HCL 50 MG TABLET: 50 | 90 days supply | Qty: 90 | Fill #0

## 2019-06-16 MED FILL — PREMPRO 0.3 MG-1.5 MG TAB: 0.3-1.5 | 28 days supply | Qty: 28 | Fill #0

## 2019-06-21 MED FILL — traZODone HCL 50 MG TABS: 50 | 90 days supply | Qty: 90 | Fill #1

## 2019-06-23 MED FILL — LISINOPRIL 10 MG TABS: 10 | 90 days supply | Qty: 90 | Fill #0

## 2019-07-23 MED FILL — PREMPRO 0.3 MG-1.5 MG TAB: 0.3-1.5 | 28 days supply | Qty: 28 | Fill #1

## 2019-08-16 MED FILL — PREMPRO 0.3 MG-1.5 MG TAB: 0.3-1.5 | 28 days supply | Qty: 28 | Fill #2

## 2019-09-12 MED FILL — PREMPRO 0.3 MG-1.5 MG TAB: 0.3-1.5 | 28 days supply | Qty: 28 | Fill #3

## 2019-10-12 MED FILL — SERTRALINE HCL 50 MG TABLET: 50 | 90 days supply | Qty: 90 | Fill #1

## 2019-10-13 MED FILL — PREMPRO 0.3 MG-1.5 MG TAB: 0.3-1.5 | 84 days supply | Qty: 84 | Fill #4

## 2019-11-24 MED FILL — LISINOPRIL 10 MG TABS: 10 | 90 days supply | Qty: 90 | Fill #1

## 2019-12-22 DIAGNOSIS — R21 Rash and other nonspecific skin eruption: Secondary | ICD-10-CM | POA: Diagnosis not present

## 2019-12-22 DIAGNOSIS — G4709 Other insomnia: Secondary | ICD-10-CM | POA: Diagnosis not present

## 2019-12-22 DIAGNOSIS — M898X1 Other specified disorders of bone, shoulder: Secondary | ICD-10-CM | POA: Diagnosis not present

## 2019-12-22 MED FILL — predniSONE 10 MG (21) TBPK: 10 | 6 days supply | Qty: 21 | Fill #0

## 2019-12-22 MED FILL — traZODone HCL 50 MG TABS: 50 | 90 days supply | Qty: 90 | Fill #0

## 2019-12-30 DIAGNOSIS — R21 Rash and other nonspecific skin eruption: Secondary | ICD-10-CM | POA: Diagnosis not present

## 2019-12-30 DIAGNOSIS — M898X1 Other specified disorders of bone, shoulder: Secondary | ICD-10-CM | POA: Diagnosis not present

## 2020-01-10 MED FILL — SERTRALINE HCL 50 MG TABLET: 50 | 90 days supply | Qty: 90 | Fill #2

## 2020-01-10 MED FILL — PREMPRO 0.3 MG-1.5 MG TAB: 0.3-1.5 | 84 days supply | Qty: 84 | Fill #5

## 2020-01-13 ENCOUNTER — Other Ambulatory Visit: Payer: Self-pay

## 2020-01-13 ENCOUNTER — Encounter: Payer: Self-pay | Admitting: Orthopaedic Surgery

## 2020-01-13 ENCOUNTER — Ambulatory Visit (INDEPENDENT_AMBULATORY_CARE_PROVIDER_SITE_OTHER): Payer: 59 | Admitting: Orthopaedic Surgery

## 2020-01-13 DIAGNOSIS — M25511 Pain in right shoulder: Secondary | ICD-10-CM

## 2020-01-13 DIAGNOSIS — G8929 Other chronic pain: Secondary | ICD-10-CM

## 2020-01-13 DIAGNOSIS — M792 Neuralgia and neuritis, unspecified: Secondary | ICD-10-CM

## 2020-01-13 DIAGNOSIS — M542 Cervicalgia: Secondary | ICD-10-CM

## 2020-01-13 NOTE — Progress Notes (Signed)
Office Visit Note   Patient: Sonya Munoz           Date of Birth: [birth year]           MRN: [id number] Visit Date: 01/13/2020              Requested by: Kathyrn Lass, [place],  [place] [address] PCP: Kathyrn Lass, MD   Assessment & Plan: Visit Diagnoses:  1. Chronic right shoulder pain   2. Cervicalgia   3. Radicular pain of right upper extremity     Plan: Given a combination of the failure of conservative treatment, assessing her x-ray findings, and her clinical exam findings showing radicular pain and weakness of the triceps I am recommending a MRI of the cervical spine to rule out nerve from impingement and a herniated disc.  This is medically necessary at this point given her weakness and the failure of all forms conservative treatment including steroids and physical therapy as well as anti-inflammatories and time.  We will see her back in 2 weeks to give her time to have the MRI.  All question concerns were answered and addressed.  Follow-Up Instructions: Return in about 2 weeks (around 01/27/2020).   Orders:  No orders of the defined types were placed in this encounter.  No orders of the defined types were placed in this encounter.     Procedures: No procedures performed   Clinical Data: No additional findings.   Subjective: Chief Complaint  Patient presents with  . Right Shoulder - Pain  The patient is a very pleasant 54 year old female who comes in with right upper extremity radicular pain that originates in the trapezius and parascapular area and from the right side of her neck.  It radiates down into her mid forearm area.  She has been through 11 weeks of this.  She gets a burning type of pain.  She is never had surgery on her neck or had a specific injury.  She is already been through anti-inflammatories and a steroid taper.  She has been to physical therapy with even dry needling.  Her symptoms are still persistent.  She has a lot of pain in  the trapezius area of her right upper extremity as well.  She denies any hand numbness or loss of strength that she is aware of.  She is a very active individual.  She takes an occasional tramadol for pain.  She has been on anti-inflammatories as well as even lidocaine patches and tried heat.  Even with rest and time this is not improving at all and is actually slowly getting worse for her.  She is otherwise a very active and healthy 54 year old female.  She is not a smoker and not a diabetic.  She is very athletic.  HPI  Review of Systems She currently denies any headache, chest pain, shortness of breath, fever, chills, nausea, vomiting  Objective: Vital Signs: There were no vitals taken for this visit.  Physical Exam She is alert and orient x3 and in no acute distress Ortho Exam Examination of her cervical spine shows that she does have full range of motion of her cervical spine but she has a positive Spurling sign to the right side.  She also has weak triceps on her right dominant side when comparing the right and left sides.  There is also parascapular pain.  There is no winging of the scapula and no muscle atrophy that I can see.  Her shoulder exam on  the right side is entirely normal with 5 out of 5 strength of the rotator cuff Specialty Comments:  No specialty comments available.  Imaging: No results found. Independent review of x-rays of the cervical spine shows significant degenerative disc disease at C5-C6.  There is slight loss of her lordosis as well.  PMFS History: Patient Active Problem List   Diagnosis Date Noted  . Right shoulder pain 12/27/2017   Past Medical History:  Diagnosis Date  . Anxiety and depression   . GERD (gastroesophageal reflux disease)   . Insomnia     History reviewed. No pertinent family history.  Past Surgical History:  Procedure Laterality Date  . AUGMENTATION MAMMAPLASTY Bilateral 1998   saline   . BREAST SURGERY Bilateral    augmentation    . fallopian tube repair    . RHINOPLASTY    . SHOULDER OPEN ROTATOR CUFF REPAIR Left    Social History   Occupational History  . Not on file  Tobacco Use  . Smoking status: Never Smoker  . Smokeless tobacco: Never Used  Substance and Sexual Activity  . Alcohol use: Yes  . Drug use: Not on file  . Sexual activity: Not on file

## 2020-01-14 ENCOUNTER — Other Ambulatory Visit: Payer: Self-pay

## 2020-01-14 DIAGNOSIS — M4807 Spinal stenosis, lumbosacral region: Secondary | ICD-10-CM

## 2020-01-24 ENCOUNTER — Ambulatory Visit (HOSPITAL_BASED_OUTPATIENT_CLINIC_OR_DEPARTMENT_OTHER)
Admission: RE | Admit: 2020-01-24 | Discharge: 2020-01-24 | Disposition: A | Discharge status: Home / Self Care | Payer: 59 | Source: Ambulatory Visit | Attending: Orthopaedic Surgery | Admitting: Orthopaedic Surgery

## 2020-01-24 ENCOUNTER — Other Ambulatory Visit: Payer: Self-pay

## 2020-01-24 DIAGNOSIS — M542 Cervicalgia: Secondary | ICD-10-CM | POA: Diagnosis not present

## 2020-01-24 DIAGNOSIS — M4807 Spinal stenosis, lumbosacral region: Secondary | ICD-10-CM | POA: Insufficient documentation

## 2020-01-28 ENCOUNTER — Encounter: Payer: Self-pay | Admitting: Physician Assistant

## 2020-01-28 ENCOUNTER — Ambulatory Visit (INDEPENDENT_AMBULATORY_CARE_PROVIDER_SITE_OTHER): Payer: 59 | Admitting: Physician Assistant

## 2020-01-28 ENCOUNTER — Other Ambulatory Visit: Payer: Self-pay

## 2020-01-28 DIAGNOSIS — M542 Cervicalgia: Secondary | ICD-10-CM

## 2020-01-28 DIAGNOSIS — M792 Neuralgia and neuritis, unspecified: Secondary | ICD-10-CM

## 2020-01-28 MED ORDER — GABAPENTIN 100 MG PO CAPS: 300.0000 mg | ORAL_CAPSULE | Freq: Every day | ORAL | 1 refills | Status: DC

## 2020-01-28 MED FILL — GABAPENTIN 100 MG CAPSULE: 100 | 30 days supply | Qty: 90 | Fill #0

## 2020-01-28 NOTE — Progress Notes (Signed)
HPI Sonya Munoz is a 54 year old female who is here today to go over the MRI of her cervical spine.  Reviewed the images with the patient today.  Also reviewed the patient MRI images and findings with Dr. Ernestina Patches.  She continues to have pain in her right upper extremity that originates from the trapezius region and radiates down to her elbow and at times into the mid forearm.  This has been ongoing for approximately 16 weeks.  She has tried formal physical therapy.  She has tried dry needling.  She tried anti-inflammatories steroid taper.  She states today her pain is 2 out of 10.  Sometimes her pain though can be 9 out of 10 pain.  Notes that her pain is better on the weekend her best day is usually Mondays.  She did feel better after the dose pack.  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 MRI of the cervical spine showed an eccentric disc bulge at C4-5 but no canal or foraminal or stenosis.  C5-C6 disc bulge with endplate osteophytes and mild canal stenosis no right foraminal stenosis mild to moderate left foraminal stenosis.  C6-C7 Minor canal stenosis.  Mild disc bulge.  No foraminal stenosis.    C7-T1 no canal or foraminal stenosis disc bulge is present.  Mild  disc bulge.  Review of systems: Please see HPI otherwise negative  Physical exam: Good range of motion cervical spine without pain.  No tenderness along medial border of either scapula.  Negative Spurling's.  5-5 strength bilateral rotator cuff.  Full range of motion of the right shoulder without pain.  Negative impingement testing right shoulder.  Impression: Radicular pain right arm  Plan: She is given a handout on cervical spine exercises she can do on her own.  We will refer to Dr. Ernestina Patches for cervical epidural steroid injection.  She will follow up with Dr. Ninfa Linden 3 weeks after the injection to  see what type of response.  We did place her on Neurontin 300 mg at night.  Questions were encouraged and answered at length.

## 2020-02-07 MED FILL — PREMPRO 0.3 MG-1.5 MG TAB: 0.3-1.5 | 28 days supply | Qty: 28 | Fill #6

## 2020-02-09 MED FILL — LISINOPRIL 10 MG TABS: 10 | 90 days supply | Qty: 90 | Fill #0

## 2020-02-10 ENCOUNTER — Other Ambulatory Visit: Payer: Self-pay

## 2020-02-10 ENCOUNTER — Ambulatory Visit (INDEPENDENT_AMBULATORY_CARE_PROVIDER_SITE_OTHER): Payer: 59 | Admitting: Physical Medicine and Rehabilitation

## 2020-02-10 ENCOUNTER — Ambulatory Visit: Payer: Self-pay

## 2020-02-10 VITALS — BP 137/92 | HR 74

## 2020-02-10 DIAGNOSIS — M5412 Radiculopathy, cervical region: Secondary | ICD-10-CM | POA: Diagnosis not present

## 2020-02-10 DIAGNOSIS — D225 Melanocytic nevi of trunk: Secondary | ICD-10-CM | POA: Diagnosis not present

## 2020-02-10 DIAGNOSIS — T691XXA Chilblains, initial encounter: Secondary | ICD-10-CM | POA: Diagnosis not present

## 2020-02-10 DIAGNOSIS — L821 Other seborrheic keratosis: Secondary | ICD-10-CM | POA: Diagnosis not present

## 2020-02-10 DIAGNOSIS — C44319 Basal cell carcinoma of skin of other parts of face: Secondary | ICD-10-CM | POA: Diagnosis not present

## 2020-02-10 MED ORDER — METHYLPREDNISOLONE ACETATE 80 MG/ML IJ SUSP
40.0000 mg | Freq: Once | INTRAMUSCULAR | Status: AC
  Administered 2020-02-10: 40 mg

## 2020-02-10 NOTE — Progress Notes (Signed)
 .  Numeric Pain Rating Scale and Functional Assessment Average Pain 7   In the last MONTH (on 0-10 scale) has pain interfered with the following?  1. General activity like being  able to carry out your everyday physical activities such as walking, climbing stairs, carrying groceries, or moving a chair?  Rating(9)   +Driver, -BT, -Dye Allergies.

## 2020-02-10 NOTE — Progress Notes (Signed)
Sonya Munoz - 54 y.o. female MRN [id number]  Date of birth: [birth year]  Office Visit Note: Visit Date: 02/10/2020 PCP: Marda Stalker, PA-C Referred by: Marda Stalker, PA-C  Subjective: Chief Complaint  Patient presents with  . Right Shoulder - Pain  . Right Arm - Pain   HPI: Sonya Munoz is a 54 y.o. female who comes in today For planned left C7-T1 interlaminar dural steroid injection. 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 She is having right radicular type arm symptoms. She has MRI following course of conservative care that was not really helpful. The patient has failed conservative care including home exercise, medications, time and activity modification.  This injection will be diagnostic and hopefully therapeutic.  Please see requesting physician notes for further details and justification.   ROS Otherwise per HPI.  Assessment & Plan: Visit Diagnoses:  1. Cervical radiculopathy     Plan: No additional findings.   Meds & Orders:  Meds ordered this encounter  Medications  . methylPREDNISolone acetate (DEPO-MEDROL) injection 40 mg    Orders Placed This Encounter  Procedures  . XR C-ARM NO REPORT  . Epidural Steroid injection    Follow-up: Return if symptoms worsen or fail to improve.   Procedures: No procedures performed  Cervical Epidural Steroid Injection - Interlaminar Approach with Fluoroscopic Guidance  Patient: Sonya Munoz      Date of Birth: November 23, 1965 MRN: [id number] PCP: Marda Stalker, PA-C      Visit Date: 02/10/2020   Universal Protocol:    Date/Time: 04/21/215:24 AM  Consent Given By: the patient  Position: PRONE  Additional Comments: Vital signs were monitored before and after the procedure. Patient was prepped and draped in the usual sterile fashion. The correct patient, procedure, and site was verified.   Injection Procedure Details:  Procedure Site One Meds Administered:  Meds  ordered this encounter  Medications  . methylPREDNISolone acetate (DEPO-MEDROL) injection 40 mg     Laterality: Right  Location/Site: C7-T1  Needle size: 20 G  Needle type: Touhy  Needle Placement: Paramedian epidural space  Findings:  -Comments: Excellent flow of contrast into the epidural space.  Procedure Details: Using a paramedian approach from the side mentioned above, the region overlying the inferior lamina was localized under fluoroscopic visualization and the soft tissues overlying this structure were infiltrated with 4 ml. of 1% Lidocaine without Epinephrine. A # 20 gauge, Tuohy needle was inserted into the epidural space using a paramedian approach.  The epidural space was localized using loss of resistance along with lateral and contralateral oblique bi-planar fluoroscopic views.  After negative aspirate for air, blood, and CSF, a 2 ml. volume of Isovue-250 was injected into the epidural space and the flow of contrast was observed. Radiographs were obtained for documentation purposes.   The injectate was administered into the level noted above.  Additional Comments:  The patient tolerated the procedure well Dressing: 2 x 2 sterile gauze and Band-Aid    Post-procedure details: Patient was observed during the procedure. Post-procedure instructions were reviewed.  Patient left the clinic in stable condition.     Clinical History: MRI CERVICAL SPINE WITHOUT CONTRAST  TECHNIQUE: Multiplanar, multisequence MR imaging of the cervical spine was performed. No intravenous contrast was administered.  COMPARISON:  None.  FINDINGS: Alignment: Trace retrolisthesis at C5-C6.  Vertebrae: Vertebral body heights are maintained. There is no significant marrow edema or suspicious osseous lesion.  Cord: No abnormal signal.  Posterior Fossa, vertebral arteries, paraspinal  tissues: Unremarkable.  Disc levels:  C2-C3:  No canal or foraminal  stenosis.  C3-C4:  No canal or foraminal stenosis.  C4-C5: Disc bulge eccentric to the right. No canal or foraminal stenosis.  C5-C6: Disc bulge with endplate osteophytes and uncovertebral hypertrophy. Mild canal stenosis. No right foraminal stenosis. Mild to moderate left foraminal stenosis.  C6-C7:  Disc bulge.  Minor canal stenosis.  No foraminal stenosis.  C7-T1:  Disc bulge.  No canal foraminal stenosis.  IMPRESSION: Minor degenerative changes without high-grade stenosis.   Electronically Signed   By: Macy Mis M.D.   On: 01/24/2020 22:46   She reports that she has never smoked. She has never used smokeless tobacco. No results for input(s): HGBA1C, LABURIC in the last 8760 hours.  Objective:  VS:  HT:    WT:   BMI:     BP:(!) 137/92  HR:74bpm  TEMP: ( )  RESP:  Physical Exam  Ortho Exam Imaging: XR C-ARM NO REPORT  Result Date: 02/10/2020 Please see Notes tab for imaging impression.   Past Medical/Family/Surgical/Social History: Medications & Allergies reviewed per EMR, new medications updated. Patient Active Problem List   Diagnosis Date Noted  . Right shoulder pain 12/27/2017   Past Medical History:  Diagnosis Date  . Anxiety and depression   . GERD (gastroesophageal reflux disease)   . Insomnia    No family history on file. Past Surgical History:  Procedure Laterality Date  . AUGMENTATION MAMMAPLASTY Bilateral 1998   saline   . BREAST SURGERY Bilateral    augmentation  . fallopian tube repair    . RHINOPLASTY    . SHOULDER OPEN ROTATOR CUFF REPAIR Left    Social History   Occupational History  . Not on file  Tobacco Use  . Smoking status: Never Smoker  . Smokeless tobacco: Never Used  Substance and Sexual Activity  . Alcohol use: Yes  . Drug use: Not on file  . Sexual activity: Not on file

## 2020-02-11 NOTE — Procedures (Signed)
Cervical Epidural Steroid Injection - Interlaminar Approach with Fluoroscopic Guidance  Patient: Sonya Munoz      Date of Birth: 08-Mar-1966 MRN: [id number] PCP: Marda Stalker, PA-C      Visit Date: 02/10/2020   Universal Protocol:    Date/Time: 04/21/215:24 AM  Consent Given By: the patient  Position: PRONE  Additional Comments: Vital signs were monitored before and after the procedure. Patient was prepped and draped in the usual sterile fashion. The correct patient, procedure, and site was verified.   Injection Procedure Details:  Procedure Site One Meds Administered:  Meds ordered this encounter  Medications  . methylPREDNISolone acetate (DEPO-MEDROL) injection 40 mg     Laterality: Right  Location/Site: C7-T1  Needle size: 20 G  Needle type: Touhy  Needle Placement: Paramedian epidural space  Findings:  -Comments: Excellent flow of contrast into the epidural space.  Procedure Details: Using a paramedian approach from the side mentioned above, the region overlying the inferior lamina was localized under fluoroscopic visualization and the soft tissues overlying this structure were infiltrated with 4 ml. of 1% Lidocaine without Epinephrine. A # 20 gauge, Tuohy needle was inserted into the epidural space using a paramedian approach.  The epidural space was localized using loss of resistance along with lateral and contralateral oblique bi-planar fluoroscopic views.  After negative aspirate for air, blood, and CSF, a 2 ml. volume of Isovue-250 was injected into the epidural space and the flow of contrast was observed. Radiographs were obtained for documentation purposes.   The injectate was administered into the level noted above.  Additional Comments:  The patient tolerated the procedure well Dressing: 2 x 2 sterile gauze and Band-Aid    Post-procedure details: Patient was observed during the procedure. Post-procedure instructions were reviewed.  Patient  left the clinic in stable condition.

## 2020-02-23 ENCOUNTER — Other Ambulatory Visit (HOSPITAL_COMMUNITY)
Admission: RE | Admit: 2020-02-23 | Discharge: 2020-02-23 | Disposition: A | Discharge status: Home / Self Care | Payer: 59 | Source: Ambulatory Visit | Attending: Family Medicine | Admitting: Family Medicine

## 2020-02-23 ENCOUNTER — Other Ambulatory Visit: Payer: Self-pay | Admitting: Family Medicine

## 2020-02-23 DIAGNOSIS — Z1211 Encounter for screening for malignant neoplasm of colon: Secondary | ICD-10-CM | POA: Diagnosis not present

## 2020-02-23 DIAGNOSIS — G4709 Other insomnia: Secondary | ICD-10-CM | POA: Diagnosis not present

## 2020-02-23 DIAGNOSIS — N951 Menopausal and female climacteric states: Secondary | ICD-10-CM | POA: Diagnosis not present

## 2020-02-23 DIAGNOSIS — Z0000 Encounter for general adult medical examination without abnormal findings: Secondary | ICD-10-CM | POA: Diagnosis not present

## 2020-02-23 DIAGNOSIS — I10 Essential (primary) hypertension: Secondary | ICD-10-CM | POA: Diagnosis not present

## 2020-02-23 DIAGNOSIS — Z124 Encounter for screening for malignant neoplasm of cervix: Secondary | ICD-10-CM | POA: Diagnosis not present

## 2020-02-23 DIAGNOSIS — F418 Other specified anxiety disorders: Secondary | ICD-10-CM | POA: Diagnosis not present

## 2020-02-26 LAB — CYTOLOGY - PAP
Adequacy: ABSENT
Comment: NEGATIVE
Diagnosis: NEGATIVE
High risk HPV: NEGATIVE

## 2020-02-26 MED FILL — GABAPENTIN 100 MG CAPSULE: 100 | 30 days supply | Qty: 90 | Fill #1

## 2020-03-02 ENCOUNTER — Encounter: Payer: Self-pay | Admitting: Orthopaedic Surgery

## 2020-03-02 ENCOUNTER — Other Ambulatory Visit: Payer: Self-pay

## 2020-03-02 ENCOUNTER — Ambulatory Visit (INDEPENDENT_AMBULATORY_CARE_PROVIDER_SITE_OTHER): Payer: 59 | Admitting: Orthopaedic Surgery

## 2020-03-02 DIAGNOSIS — M542 Cervicalgia: Secondary | ICD-10-CM

## 2020-03-02 DIAGNOSIS — M792 Neuralgia and neuritis, unspecified: Secondary | ICD-10-CM | POA: Diagnosis not present

## 2020-03-02 NOTE — Progress Notes (Signed)
The patient comes in today after having a right-sided C7/T1 injection by Dr. Ernestina Patches.  She reports significant improvement in her pain.  There is no radicular symptoms going on her right arm anymore.  She is over 80% better she states.  She does have a vacation coming up.  She is interested in physical therapy on her cervical spine.  On exam today her neck moves good.  She has 5 out of 5 strength in the bilateral upper extremities and normal sensation.  Certainly we can have her set up for outpatient physical therapy since she is interested in this and this will probably continue to help her.  She will continue Neurontin for at least 1 more month and then can stop Neurontin.  She would like to have physical therapy done here since she works in [place].  She is going on vacation at the end of next week and so she would like to consider physical therapy starting after June 2.  From my standpoint follow-up can be as needed.  All questions and concerns were answered and addressed.

## 2020-03-08 MED FILL — PREMPRO 0.3 MG-1.5 MG TAB: 0.3-1.5 | 84 days supply | Qty: 84 | Fill #7

## 2020-03-24 ENCOUNTER — Other Ambulatory Visit: Payer: Self-pay

## 2020-03-24 ENCOUNTER — Ambulatory Visit (INDEPENDENT_AMBULATORY_CARE_PROVIDER_SITE_OTHER): Payer: 59 | Admitting: Physical Therapy

## 2020-03-24 ENCOUNTER — Encounter: Payer: Self-pay | Admitting: Physical Therapy

## 2020-03-24 DIAGNOSIS — M542 Cervicalgia: Secondary | ICD-10-CM | POA: Diagnosis not present

## 2020-03-24 DIAGNOSIS — R29898 Other symptoms and signs involving the musculoskeletal system: Secondary | ICD-10-CM

## 2020-03-24 DIAGNOSIS — R293 Abnormal posture: Secondary | ICD-10-CM

## 2020-03-24 NOTE — Therapy (Signed)
[facility] Physical Therapy [address] [place], [place], [address] Phone: [phone]   Fax:  [phone]  Physical Therapy Evaluation  Patient Details  Name: Sonya Munoz MRN: [id number] Date of Birth: 1966-01-26 Referring Provider (PT): Mcarthur Rossetti, MD   Encounter Date: 03/24/2020  PT End of Session - 03/24/20 0927    Visit Number  1    Number of Visits  6    Date for PT Re-Evaluation  05/05/20    PT Start Time  0800    PT Stop Time  0841    PT Time Calculation (min)  41 min    Activity Tolerance  Patient tolerated treatment well    Behavior During Therapy  [facility] for tasks assessed/performed       Past Medical History:  Diagnosis Date  . Anxiety and depression   . GERD (gastroesophageal reflux disease)   . Insomnia     Past Surgical History:  Procedure Laterality Date  . AUGMENTATION MAMMAPLASTY Bilateral 1998   saline   . BREAST SURGERY Bilateral    augmentation  . fallopian tube repair    . RHINOPLASTY    . SHOULDER OPEN ROTATOR CUFF REPAIR Left     There were no vitals filed for this visit.   Subjective Assessment - 03/24/20 0804    Subjective  Pt is a 54 y/o female who presents to OPPT for neck pain x 6 months.  Pt very active with exercise, and tried to self treat x 8 weeks.  Pt then had ESI with improvement in radicular symptoms.  Pt reports still has some residual scapular pain on Rt side.    Pertinent History  anxiety, depression    Limitations  Sitting    Diagnostic tests  xrays, MRI    Patient Stated Goals  get back to 100%, stop pain    Currently in Pain?  Yes    Pain Score  1    up to 2/10   Pain Location  Scapula    Pain Orientation  Right    Pain Descriptors / Indicators  Aching;Burning    Pain Type  Acute pain    Pain Onset  More than a month ago    Pain Frequency  Intermittent    Aggravating Factors   sitting, repetitive RUE movement    Pain Relieving Factors  repositioning         [facility] PT  Assessment - 03/24/20 0808      Assessment   Medical Diagnosis  M54.2 (ICD-10-CM) - Cervicalgia    Referring Provider (PT)  Mcarthur Rossetti, MD    Onset Date/Surgical Date  --   Jan 2021   Hand Dominance  Right    Next MD Visit  PRN    Prior Therapy  [facility] - has had DN      Precautions   Precautions  None      Restrictions   Weight Bearing Restrictions  No      Balance Screen   Has the patient fallen in the past 6 months  No    Has the patient had a decrease in activity level because of a fear of falling?   No    Is the patient reluctant to leave their home because of a fear of falling?   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, computer work;     Leisure  reading, going out to dinner, travel, weightlifting/cardio 2-3 days weightlifting, 3 days cardio (trail/treadmill) - has Physiological scientist 2 days/week      Cognition   Overall Cognitive Status  Within Functional Limits for tasks assessed      Posture/Postural Control   Posture/Postural Control  Postural limitations    Postural Limitations  Rounded Shoulders;Forward head      ROM / Strength   AROM / PROM / Strength  AROM;Strength      AROM   AROM Assessment Site  Cervical    Cervical Flexion  50   with tightness   Cervical Extension  50    Cervical - Right Side Bend  27    Cervical - Left Side Bend  25    Cervical - Right Rotation  76    Cervical - Left Rotation  70      Strength   Strength Assessment Site  Shoulder    Right/Left Shoulder  Right;Left    Right Shoulder Flexion  4/5    Right Shoulder ABduction  4/5    Left Shoulder Flexion  5/5    Left Shoulder ABduction  5/5      Palpation   Spinal mobility  hypomobile T 2-6, otherwise WNL    Palpation  comment  trigger points noted in levator scapula      Special Tests    Special Tests  Cervical    Cervical Tests  Spurling's      Spurling's   Findings  Negative                  Objective measurements completed on examination: See above findings.      [place] [facility] Treatment/Exercise - 03/24/20 0808      Exercises   Exercises  Other Exercises    Other Exercises   verbally reviewed and demonstrated HEP to pt- pt verbalized understanding and performed 1-2 reps of each exercise      Manual Therapy   Manual Therapy  Soft tissue mobilization    Manual therapy comments  skilled palpation of soft tissue during DN    Soft tissue mobilization  Rt upper trap/levator scapula and into rhomboids       Trigger Point Dry Needling - 03/24/20 0925    Consent Given?  Yes    Education Handout Provided  Yes    Muscles Treated Head and Neck  Levator scapulae    Muscles Treated Upper Quadrant  Subscapularis    Muscles Treated Back/Hip  Thoracic multifidi    Levator Scapulae Response  Twitch response elicited;Palpable increased muscle length    Subscapularis Response  Twitch response elicited    Thoracic multifidi response  Twitch response elicited           PT Education - 03/24/20 0926    Education Details  HEP, DN    Person(s) Educated  Patient    Methods  Explanation;Demonstration;Handout          PT Long Term Goals - 03/24/20 0946      PT LONG TERM GOAL #1   Title  independent with HEP    Status  New    Target Date  05/05/20      PT LONG TERM GOAL #2   Title  report decreased  pain and tightness with cervical flexion for improved symptoms    Status  New    Target Date  05/05/20      PT LONG TERM GOAL #3   Title  report no pain with job responsibilities for improved function    Status  New    Target Date  05/05/20      PT LONG TERM GOAL #4   Title  demonstrate 5/5 Rt shoulder strength for improved function    Status  New    Target Date  05/05/20              Plan - 03/24/20 0941    Clinical Impression Statement  Pt is a 54 y/o female who presents to OPPT with Rt sided neck and shoulder pain with hx of radicular symptoms.  Pt reports radicular symptoms resolved after ESI, and still with mild residual Rt shoulder/scapular pain.  Pt with mild strength deficits on Rt and active trigger points.  Will benefit from PT to address deficits listed.    Personal Factors and Comorbidities  Comorbidity 2    Comorbidities  anxiety, depression    Examination-Activity Limitations  Lift    Examination-Participation Restrictions  Other   occupation   Stability/Clinical Decision Making  Stable/Uncomplicated    Clinical Decision Making  Low    Rehab Potential  Good    PT Frequency  1x / week    PT Duration  6 weeks    PT Treatment/Interventions  ADLs/Self Care Home Management;Cryotherapy;Electrical Stimulation;Traction;Moist Heat;Therapeutic activities;Therapeutic exercise;Patient/family education;Neuromuscular re-education;Manual techniques;Taping;Dry needling;Spinal Manipulations    PT Next Visit Plan  assess response to DN, review and update HEP PRN, manual/modalities/DN PRN    PT Home Exercise Plan  Access Code: [email or web address]    Consulted and Agree with Plan of Care  Patient       Patient will benefit from skilled therapeutic intervention in order to improve the following deficits and impairments:  Increased fascial restricitons, Increased muscle spasms, Pain, Postural dysfunction, Decreased strength  Visit Diagnosis: Cervicalgia - Plan: PT plan of care cert/re-cert  Other symptoms and signs involving the musculoskeletal system - Plan: PT plan of care cert/re-cert  Abnormal posture - Plan: PT plan of care cert/re-cert     Problem List Patient Active Problem List   Diagnosis Date Noted  . Right shoulder pain 12/27/2017      Laureen Abrahams, PT, DPT 03/24/20 9:50 AM    [facility] Physical Therapy [address] [place], [place], [address] Phone: [phone]   Fax:  [phone]  Name: Sonya Munoz MRN: [id number] Date of Birth: 10/20/1966

## 2020-03-24 NOTE — Patient Instructions (Signed)
Access Code: [email or web address] URL: [email or web address] Date: 03/24/2020 Prepared by: Faustino Congress  Exercises Supine Chin Tuck - 2 x daily - 7 x weekly - 10 reps - 1 sets - 5 sec hold Doorway Pec Stretch at 90 Degrees Abduction - 2 x daily - 7 x weekly - 1 sets - 3 reps - 30 sec hold Standing Backward Shoulder Rolls - 2 x daily - 7 x weekly - 10 reps - 1 sets Seated Scapular Retraction - 2 x daily - 7 x weekly - 10 reps - 1 sets - 5 sec hold  Patient Education Trigger Point Dry Needling

## 2020-03-26 DIAGNOSIS — N951 Menopausal and female climacteric states: Secondary | ICD-10-CM | POA: Diagnosis not present

## 2020-03-26 DIAGNOSIS — G4709 Other insomnia: Secondary | ICD-10-CM | POA: Diagnosis not present

## 2020-03-26 DIAGNOSIS — Z1211 Encounter for screening for malignant neoplasm of colon: Secondary | ICD-10-CM | POA: Diagnosis not present

## 2020-03-26 DIAGNOSIS — Z13220 Encounter for screening for lipoid disorders: Secondary | ICD-10-CM | POA: Diagnosis not present

## 2020-03-26 DIAGNOSIS — F418 Other specified anxiety disorders: Secondary | ICD-10-CM | POA: Diagnosis not present

## 2020-03-26 DIAGNOSIS — I10 Essential (primary) hypertension: Secondary | ICD-10-CM | POA: Diagnosis not present

## 2020-03-26 DIAGNOSIS — Z0000 Encounter for general adult medical examination without abnormal findings: Secondary | ICD-10-CM | POA: Diagnosis not present

## 2020-03-31 DIAGNOSIS — C44319 Basal cell carcinoma of skin of other parts of face: Secondary | ICD-10-CM | POA: Diagnosis not present

## 2020-03-31 DIAGNOSIS — Z85828 Personal history of other malignant neoplasm of skin: Secondary | ICD-10-CM | POA: Diagnosis not present

## 2020-04-07 ENCOUNTER — Encounter: Payer: Self-pay | Admitting: Physical Therapy

## 2020-04-07 ENCOUNTER — Other Ambulatory Visit: Payer: Self-pay

## 2020-04-07 ENCOUNTER — Ambulatory Visit (INDEPENDENT_AMBULATORY_CARE_PROVIDER_SITE_OTHER): Payer: 59 | Admitting: Physical Therapy

## 2020-04-07 DIAGNOSIS — R29898 Other symptoms and signs involving the musculoskeletal system: Secondary | ICD-10-CM | POA: Diagnosis not present

## 2020-04-07 DIAGNOSIS — R293 Abnormal posture: Secondary | ICD-10-CM

## 2020-04-07 DIAGNOSIS — M542 Cervicalgia: Secondary | ICD-10-CM

## 2020-04-07 NOTE — Therapy (Signed)
[facility] [address] [place], [place], [address] Phone: [phone]   Fax:  [phone]  Physical Therapy Treatment  Patient Details  Name: Sonya Munoz MRN: [id number] Date of Birth: 03-12-1966 Referring Provider (PT): Mcarthur Rossetti, MD   Encounter Date: 04/07/2020   PT End of Session - 04/07/20 0827    Visit Number 2    Number of Visits 6    Date for PT Re-Evaluation 05/05/20    PT Start Time 0802    PT Stop Time 0842    PT Time Calculation (min) 40 min    Activity Tolerance Patient tolerated treatment well    Behavior During Therapy [facility] for tasks assessed/performed           Past Medical History:  Diagnosis Date   Anxiety and depression    GERD (gastroesophageal reflux disease)    Insomnia     Past Surgical History:  Procedure Laterality Date   AUGMENTATION MAMMAPLASTY Bilateral 1998   saline    BREAST SURGERY Bilateral    augmentation   fallopian tube repair     RHINOPLASTY     SHOULDER OPEN ROTATOR CUFF REPAIR Left     There were no vitals filed for this visit.   Subjective Assessment - 04/07/20 0804    Subjective "I'm tired of having this stupid pain."  Still present, DN wasn't very beneficial.    Pertinent History anxiety, depression    Limitations Sitting    Diagnostic tests xrays, MRI    Patient Stated Goals get back to 100%, stop pain    Currently in Pain? Yes    Pain Score 3     Pain Location Scapula    Pain Orientation Right    Pain Descriptors / Indicators Aching;Burning    Pain Type Acute pain    Pain Onset More than a month ago    Pain Frequency Intermittent    Aggravating Factors  sitting, repetitive RUE movement    Pain Relieving Factors repositioning                             [facility] Adult PT Treatment/Exercise - 04/07/20 0806      Self-Care   Self-Care Other Self-Care Comments    Other Self-Care Comments  educated in TENS unit and  benefits/application, use of theracane for trigger points      Exercises   Exercises Neck      Neck Exercises: Machines for Strengthening   UBE (Upper Arm Bike) L2.5 x 4 min (2' each direction)      Modalities   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    Electrical Stimulation Action IFC    Electrical Stimulation Parameters to tolerance    Electrical Stimulation Goals Pain;Tone      Traction   Type of Traction Cervical    Min (lbs) 15    Max (lbs) 20    Hold Time 60    Rest Time 20    Time 15                  PT Education - 04/07/20 0827    Education Details TENS    Person(s) Educated Patient    Methods Explanation;Handout    Comprehension Verbalized understanding               PT Long Term Goals - 03/24/20 [phone]  PT LONG TERM GOAL #1   Title independent with HEP    Status New    Target Date 05/05/20      PT LONG TERM GOAL #2   Title report decreased pain and tightness with cervical flexion for improved symptoms    Status New    Target Date 05/05/20      PT LONG TERM GOAL #3   Title report no pain with job responsibilities for improved function    Status New    Target Date 05/05/20      PT LONG TERM GOAL #4   Title demonstrate 5/5 Rt shoulder strength for improved function    Status New    Target Date 05/05/20                 Plan - 04/07/20 0831    Clinical Impression Statement Pt reporting no significant change from previous session with DN, and still continues to be active with exercise.  Trial of traction and estim today per her request to see if modalities will be helpful to see if she gets some relief.  Will continue to benefit from PT to maximize function.    Personal Factors and Comorbidities Comorbidity 2    Comorbidities anxiety, depression    Examination-Activity Limitations Lift    Examination-Participation Restrictions Other   occupation    Stability/Clinical Decision Making Stable/Uncomplicated    Rehab Potential Good    PT Frequency 1x / week    PT Duration 6 weeks    PT Treatment/Interventions ADLs/Self Care Home Management;Cryotherapy;Electrical Stimulation;Traction;Moist Heat;Therapeutic activities;Therapeutic exercise;Patient/family education;Neuromuscular re-education;Manual techniques;Taping;Dry needling;Spinal Manipulations    PT Next Visit Plan assess response to DN, review and update HEP PRN, manual/modalities/DN PRN    PT Home Exercise Plan Access Code: [email or web address]    [id number] and Agree with Plan of Care Patient           Patient will benefit from skilled therapeutic intervention in order to improve the following deficits and impairments:  Increased fascial restricitons, Increased muscle spasms, Pain, Postural dysfunction, Decreased strength  Visit Diagnosis: Cervicalgia  Other symptoms and signs involving the musculoskeletal system  Abnormal posture     Problem List Patient Active Problem List   Diagnosis Date Noted   Right shoulder pain 12/27/2017     Laureen Abrahams, PT, DPT 04/07/20 8:44 AM    [facility] [address] [place], [place], [address] Phone: [phone]   Fax:  [phone]  Name: Sonya Munoz MRN: [id number] Date of Birth: [birth year]

## 2020-04-07 NOTE — Patient Instructions (Signed)
Access Code: [email or web address] URL: [email or web address] Date: 04/07/2020 Prepared by: Faustino Congress  Exercises Supine Chin Tuck - 2 x daily - 7 x weekly - 10 reps - 1 sets - 5 sec hold Doorway Pec Stretch at 90 Degrees Abduction - 2 x daily - 7 x weekly - 1 sets - 3 reps - 30 sec hold Standing Backward Shoulder Rolls - 2 x daily - 7 x weekly - 10 reps - 1 sets Seated Scapular Retraction - 2 x daily - 7 x weekly - 10 reps - 1 sets - 5 sec hold  Patient Education Trigger Point Dry Needling TENS Unit

## 2020-04-12 MED FILL — SERTRALINE HCL 50 MG TABS: 50 | 90 days supply | Qty: 90 | Fill #3

## 2020-04-14 ENCOUNTER — Other Ambulatory Visit: Payer: Self-pay

## 2020-04-14 ENCOUNTER — Ambulatory Visit (INDEPENDENT_AMBULATORY_CARE_PROVIDER_SITE_OTHER): Payer: 59 | Admitting: Physical Therapy

## 2020-04-14 ENCOUNTER — Encounter: Payer: Self-pay | Admitting: Physical Therapy

## 2020-04-14 DIAGNOSIS — R293 Abnormal posture: Secondary | ICD-10-CM | POA: Diagnosis not present

## 2020-04-14 DIAGNOSIS — R29898 Other symptoms and signs involving the musculoskeletal system: Secondary | ICD-10-CM

## 2020-04-14 DIAGNOSIS — M542 Cervicalgia: Secondary | ICD-10-CM

## 2020-04-14 NOTE — Therapy (Addendum)
[facility] [address] [place], [place], [address] Phone: [phone]   Fax:  [phone]  Physical Therapy Treatment/Discharge Summary  Patient Details  Name: Sonya Munoz MRN: [id number] Date of Birth: 1966/09/22 Referring Provider (PT): Mcarthur Rossetti, MD   Encounter Date: 04/14/2020   PT End of Session - 04/14/20 1541    Visit Number 3    Number of Visits 6    Date for PT Re-Evaluation 05/05/20    PT Start Time 8882    PT Stop Time 1550    PT Time Calculation (min) 35 min    Activity Tolerance Patient tolerated treatment well    Behavior During Therapy [facility] for tasks assessed/performed           Past Medical History:  Diagnosis Date  . Anxiety and depression   . GERD (gastroesophageal reflux disease)   . Insomnia     Past Surgical History:  Procedure Laterality Date  . AUGMENTATION MAMMAPLASTY Bilateral 1998   saline   . BREAST SURGERY Bilateral    augmentation  . fallopian tube repair    . RHINOPLASTY    . SHOULDER OPEN ROTATOR CUFF REPAIR Left     There were no vitals filed for this visit.   Subjective Assessment - 04/14/20 1518    Subjective traction and estim were very helpful.    Pertinent History anxiety, depression    Limitations Sitting    Diagnostic tests xrays, MRI    Patient Stated Goals get back to 100%, stop pain    Currently in Pain? No/denies    Pain Onset More than a month ago                             [facility] Adult PT Treatment/Exercise - 04/14/20 1540      Self-Care   Self-Care Other Self-Care Comments    Other Self-Care Comments  reviewed TENS application and location of electrodes, also different modes to try      Neck Exercises: Machines for Strengthening   UBE (Upper Arm Bike) L3 x 4 min (2' each direction)      Traction   Type of Traction Cervical    Min (lbs) 15    Max (lbs) 20    Hold Time 60    Rest Time 20    Time 15                  PT  Education - 04/14/20 1541    Education Details see self care    Person(s) Educated Patient    Methods Explanation    Comprehension Verbalized understanding               PT Long Term Goals - 04/14/20 1546      PT LONG TERM GOAL #1   Title independent with HEP    Status Achieved      PT LONG TERM GOAL #2   Title report decreased pain and tightness with cervical flexion for improved symptoms    Status Achieved      PT LONG TERM GOAL #3   Title report no pain with job responsibilities for improved function    Status Achieved      PT LONG TERM GOAL #4   Title demonstrate 5/5 Rt shoulder strength for improved function    Status On-going    Target Date 05/05/20  Plan - 04/14/20 1542    Clinical Impression Statement Pt with positive response to traction last session and requested repeat today.  Will hold PT and she will call if needed in the next 30 days as symptoms greatly improved at this time.    Personal Factors and Comorbidities Comorbidity 2    Comorbidities anxiety, depression    Examination-Activity Limitations Lift    Examination-Participation Restrictions Other   occupation   Stability/Clinical Decision Making Stable/Uncomplicated    Rehab Potential Good    PT Frequency 1x / week    PT Duration 6 weeks    PT Treatment/Interventions ADLs/Self Care Home Management;Cryotherapy;Electrical Stimulation;Traction;Moist Heat;Therapeutic activities;Therapeutic exercise;Patient/family education;Neuromuscular re-education;Manual techniques;Taping;Dry needling;Spinal Manipulations    PT Next Visit Plan assess response to DN, review and update HEP PRN, manual/modalities/DN PRN    PT Home Exercise Plan Access Code: [email or web address]    Consulted and Agree with Plan of Care Patient           Patient will benefit from skilled therapeutic intervention in order to improve the following deficits and impairments:  Increased fascial restricitons, Increased muscle  spasms, Pain, Postural dysfunction, Decreased strength  Visit Diagnosis: Cervicalgia  Other symptoms and signs involving the musculoskeletal system  Abnormal posture     Problem List Patient Active Problem List   Diagnosis Date Noted  . Right shoulder pain 12/27/2017      Laureen Abrahams, PT, DPT 04/14/20 3:47 PM    [facility] [address] [place], [place], [address] Phone: [phone]   Fax:  [phone]  Name: Sonya Munoz MRN: [id number] Date of Birth: 1966-05-05     PHYSICAL THERAPY DISCHARGE SUMMARY  Visits from Start of Care: 3  Current functional level related to goals / functional outcomes: See above   Remaining deficits: See above   Education / Equipment: HEP  Plan: Patient agrees to discharge.  Patient goals were partially met. Patient is being discharged due to being pleased with the current functional level.  ?????     Laureen Abrahams, PT, DPT 06/03/20 11:14 AM  [facility] [address] [place], [place], [address] Phone: [phone]   Fax:  [phone]

## 2020-05-04 ENCOUNTER — Other Ambulatory Visit: Payer: Self-pay | Admitting: Family Medicine

## 2020-05-04 DIAGNOSIS — Z1231 Encounter for screening mammogram for malignant neoplasm of breast: Secondary | ICD-10-CM

## 2020-05-05 ENCOUNTER — Other Ambulatory Visit: Payer: Self-pay

## 2020-05-05 ENCOUNTER — Ambulatory Visit (INDEPENDENT_AMBULATORY_CARE_PROVIDER_SITE_OTHER): Payer: 59 | Admitting: Podiatry

## 2020-05-05 ENCOUNTER — Ambulatory Visit (INDEPENDENT_AMBULATORY_CARE_PROVIDER_SITE_OTHER): Payer: 59

## 2020-05-05 DIAGNOSIS — M2061 Acquired deformities of toe(s), unspecified, right foot: Secondary | ICD-10-CM

## 2020-05-05 DIAGNOSIS — M2021 Hallux rigidus, right foot: Secondary | ICD-10-CM | POA: Diagnosis not present

## 2020-05-05 NOTE — Patient Instructions (Signed)
If was nice to meet you today. If you have any questions or any further concerns, please feel fee to give me a call. You can call our office at [phone] or please feel fee to send me a message through MyChart.   

## 2020-05-06 ENCOUNTER — Ambulatory Visit: Payer: 59 | Admitting: Podiatry

## 2020-05-11 NOTE — Progress Notes (Signed)
Subjective:   Patient ID: Sonya Munoz, female   DOB: 54 y.o.   MRN: [id number]   HPI 54 year old female presents the office today for concerns of a painful bunion on the right foot which has been ongoing now for several years and seems to be getting worse.  She is having difficulty bending her toe and it hurts to bend her toe.  She does get some swelling occasionally.  She states it hurts mostly with shoes she is tried changing shoes, offloading padding the insignificant improvement.  She was to discuss surgical intervention at this time.   Review of Systems  All other systems reviewed and are negative.  Past Medical History:  Diagnosis Date  . Anxiety and depression   . GERD (gastroesophageal reflux disease)   . Insomnia     Past Surgical History:  Procedure Laterality Date  . AUGMENTATION MAMMAPLASTY Bilateral 1998   saline   . BREAST SURGERY Bilateral    augmentation  . fallopian tube repair    . RHINOPLASTY    . SHOULDER OPEN ROTATOR CUFF REPAIR Left      Current Outpatient Medications:  .  estrogen, conjugated,-medroxyprogesterone (PREMPRO) 0.3-1.5 MG tablet, Take 1 tablet by mouth daily., Disp: , Rfl:  .  gabapentin (NEURONTIN) 100 MG capsule, Take 3 capsules (300 mg total) by mouth at bedtime., Disp: 90 capsule, Rfl: 1 .  lisinopril (PRINIVIL,ZESTRIL) 10 MG tablet, Take 10 mg by mouth daily., Disp: , Rfl:  .  omeprazole (PRILOSEC) 20 MG capsule, Take 20 mg by mouth daily., Disp: , Rfl:  .  sertraline (ZOLOFT) 50 MG tablet, Take 50 mg by mouth daily., Disp: , Rfl:  .  traZODone (DESYREL) 25 mg TABS tablet, Take 25 mg by mouth at bedtime., Disp: , Rfl:  .  triamcinolone cream (KENALOG) 0.1 %, Apply 1 application topically 2 (two) times daily., Disp: 30 g, Rfl: 0  No Known Allergies       Objective:  Physical Exam  General: AAO x3, NAD  Dermatological: Mild erythema along the area of the "bunion" from irritation inside shoes but there is no skin breakdown or  fluctuation.  No open lesions.  Vascular: Dorsalis Pedis artery and Posterior Tibial artery pedal pulses are 2/4 bilateral with immedate capillary fill time. There is no pain with calf compression, swelling, warmth, erythema.   Neruologic: Grossly intact via light touch bilateral.   Musculoskeletal: Dorsal medial spurring present of the first MPJ.  Decreased range of motion of first MPJ on the right side with crepitus with range of motion.  Tender palpation of the first MPJ.  Muscular strength 5/5 in all groups tested bilateral.  Gait: Unassisted, Nonantalgic.       Assessment:   Right foot hallux rigidus     Plan:  -Treatment options discussed including all alternatives, risks, and complications -Etiology of symptoms were discussed -X-rays were obtained and reviewed with the patient.  Arthritic changes present of the first MPJ on the right side.  No evidence of acute fracture.  Dorsal spurring evident. -We discussed both conservative as well as surgical treatment options.  This time she wishes to proceed with surgical intervention in the near future.  We discussed all options for surgery and ultimately decided for first MPJ arthrodesis.  Discussed the surgery as well as postoperative course.  She is going to consider when she wants to do this let us know.  Trula Slade DPM

## 2020-05-18 ENCOUNTER — Ambulatory Visit
Admission: RE | Admit: 2020-05-18 | Discharge: 2020-05-18 | Disposition: A | Discharge status: Home / Self Care | Payer: 59 | Source: Ambulatory Visit

## 2020-05-18 ENCOUNTER — Other Ambulatory Visit: Payer: Self-pay

## 2020-05-18 ENCOUNTER — Other Ambulatory Visit: Payer: Self-pay | Admitting: Family Medicine

## 2020-05-18 DIAGNOSIS — Z1231 Encounter for screening mammogram for malignant neoplasm of breast: Secondary | ICD-10-CM | POA: Diagnosis not present

## 2020-05-24 MED FILL — traZODone HCL 50 MG TABS: 50 | 90 days supply | Qty: 90 | Fill #1

## 2020-05-24 MED FILL — LISINOPRIL 10 MG TABS: 10 | 90 days supply | Qty: 90 | Fill #0

## 2020-05-25 ENCOUNTER — Other Ambulatory Visit (HOSPITAL_COMMUNITY): Payer: Self-pay | Admitting: Family Medicine

## 2020-05-25 MED FILL — PREMPRO 0.3 MG-1.5 MG TAB: 0.3-1.5 | 84 days supply | Qty: 84 | Fill #0

## 2020-06-08 ENCOUNTER — Telehealth: Payer: Self-pay | Admitting: Podiatry

## 2020-06-08 NOTE — Telephone Encounter (Signed)
I'm calling to schedule a sx date with Dr. Jacqualyn Posey. I just saw him and he told me to take time and call back. Pt requested to schedule sx for December. I tentatively scheduled pt for sx on Wednesday, December 15 th. Pt is scheduled to come in and see Dr. Jacqualyn Posey on Monday, November 22 to go over her sx and sign her consent forms. Pt stated she would call Renisha the first week of September to discuss insurance and wether to have her sx at [facility] or through [facility]. Told pt to call with any other questions/concerns.

## 2020-07-13 ENCOUNTER — Other Ambulatory Visit (HOSPITAL_COMMUNITY): Payer: Self-pay | Admitting: Family Medicine

## 2020-07-13 MED FILL — SERTRALINE HCL 50 MG TABLET: 50 | 90 days supply | Qty: 90 | Fill #0

## 2020-07-30 DIAGNOSIS — H5213 Myopia, bilateral: Secondary | ICD-10-CM | POA: Diagnosis not present

## 2020-08-23 ENCOUNTER — Other Ambulatory Visit (HOSPITAL_COMMUNITY): Payer: Self-pay | Admitting: Family Medicine

## 2020-08-23 DIAGNOSIS — D2272 Melanocytic nevi of left lower limb, including hip: Secondary | ICD-10-CM | POA: Diagnosis not present

## 2020-08-23 DIAGNOSIS — D225 Melanocytic nevi of trunk: Secondary | ICD-10-CM | POA: Diagnosis not present

## 2020-08-23 DIAGNOSIS — D2261 Melanocytic nevi of right upper limb, including shoulder: Secondary | ICD-10-CM | POA: Diagnosis not present

## 2020-08-23 DIAGNOSIS — L400 Psoriasis vulgaris: Secondary | ICD-10-CM | POA: Diagnosis not present

## 2020-08-23 DIAGNOSIS — L821 Other seborrheic keratosis: Secondary | ICD-10-CM | POA: Diagnosis not present

## 2020-08-23 DIAGNOSIS — L720 Epidermal cyst: Secondary | ICD-10-CM | POA: Diagnosis not present

## 2020-08-23 DIAGNOSIS — Z85828 Personal history of other malignant neoplasm of skin: Secondary | ICD-10-CM | POA: Diagnosis not present

## 2020-08-23 MED FILL — FLUOCINONIDE 0.05% SOLUTION: 0.05 | 30 days supply | Qty: 60 | Fill #0

## 2020-08-23 MED FILL — PREMPRO 0.3 MG-1.5 MG TAB: 0.3-1.5 | 56 days supply | Qty: 56 | Fill #0

## 2020-08-24 DIAGNOSIS — M79676 Pain in unspecified toe(s): Secondary | ICD-10-CM

## 2020-08-27 MED FILL — LISINOPRIL 10 MG TABS: 10 | 90 days supply | Qty: 90 | Fill #0

## 2020-09-13 ENCOUNTER — Ambulatory Visit (INDEPENDENT_AMBULATORY_CARE_PROVIDER_SITE_OTHER): Payer: 59 | Admitting: Podiatry

## 2020-09-13 ENCOUNTER — Other Ambulatory Visit: Payer: Self-pay

## 2020-09-13 DIAGNOSIS — M2021 Hallux rigidus, right foot: Secondary | ICD-10-CM

## 2020-09-13 DIAGNOSIS — M2061 Acquired deformities of toe(s), unspecified, right foot: Secondary | ICD-10-CM

## 2020-09-13 NOTE — Patient Instructions (Signed)

## 2020-09-20 NOTE — Progress Notes (Addendum)
Subjective: 54 year old female presents the office today for surgical consultation for painful first IPJ of her right foot.  She is continued have discomfort the first MPJ she has difficulty bending her toe without discomfort.  She is tried shoe modifications, offloading, anti-inflammatories or insignificant provement this time she wishes proceed with surgical intervention. Denies any systemic complaints such as fevers, chills, nausea, vomiting. No acute changes since last appointment, and no other complaints at this time.   Objective: AAO x3, NAD DP/PT pulses palpable bilaterally, CRT less than 3 seconds Decreased range of motion of first MPJ with crepitation with MPJ range of motion.  There is edema to the first MPJ with insignificant erythema or warmth.  No other areas of discomfort identified this time.  MMT 5/5. No pain with calf compression, swelling, warmth, erythema  Assessment: 54 year old female with hallux rigidus left foot, capsulitis  Plan: -All treatment options discussed with the patient including all alternatives, risks, complications.  -Reviewed the x-rays from previous.  Discussed the surgical's postoperative course as well as alternatives risks complications.  This time she was to proceed with surgical intervention.  Discussed with her procedure arthrodesis and she wants to proceed with this. -The incision placement as well as the postoperative course was discussed with the patient. I discussed risks of the surgery which include, but not limited to, infection, bleeding, pain, swelling, need for further surgery, delayed or nonhealing, painful or ugly scar, numbness or sensation changes, over/under correction, recurrence, transfer lesions, further deformity, hardware failure, DVT/PE, loss of toe/foot. Patient understands these risks and wishes to proceed with surgery. The surgical consent was reviewed with the patient all 3 pages were signed. No promises or guarantees were given to  the outcome of the procedure. All questions were answered to the best of my ability. Before the surgery the patient was encouraged to call the office if there is any further questions. The surgery will be performed at the [facility] on an outpatient basis. -CAM boot dispensed for postop use.  -Patient encouraged to call the office with any questions, concerns, change in symptoms.    Trula Slade DPM

## 2020-09-24 ENCOUNTER — Telehealth: Payer: Self-pay | Admitting: Podiatry

## 2020-09-24 NOTE — Telephone Encounter (Signed)
DOS: 10/06/2020  Procedure: Hallux MPJ Fusion Rt (94834)  [facility] UMR Effective From: 09/22/2016 -  Deductible: $1,500 with $1,500 met and $0 remaining. Out of Pocket: $4,000 with $3,184.36 met and $815.64 remaining. CoInsurance: 20% Copay: $  Per Meryle Ready W no Prior Authorization is required. Call Reference # [id number].

## 2020-09-27 ENCOUNTER — Other Ambulatory Visit (HOSPITAL_COMMUNITY): Payer: Self-pay | Admitting: Family Medicine

## 2020-09-27 MED FILL — traZODone HCL 50 MG TABS: 50 | 90 days supply | Qty: 90 | Fill #0

## 2020-09-30 DIAGNOSIS — M2021 Hallux rigidus, right foot: Secondary | ICD-10-CM | POA: Diagnosis not present

## 2020-10-02 MED FILL — SERTRALINE HCL 50 MG TABLET: 50 | 90 days supply | Qty: 90 | Fill #1

## 2020-10-06 ENCOUNTER — Other Ambulatory Visit: Payer: Self-pay | Admitting: Podiatry

## 2020-10-06 ENCOUNTER — Encounter: Payer: Self-pay | Admitting: Podiatry

## 2020-10-06 DIAGNOSIS — M2021 Hallux rigidus, right foot: Secondary | ICD-10-CM

## 2020-10-06 DIAGNOSIS — M25571 Pain in right ankle and joints of right foot: Secondary | ICD-10-CM | POA: Diagnosis not present

## 2020-10-06 MED ORDER — OXYCODONE-ACETAMINOPHEN 5-325 MG PO TABS: 1.0000 | ORAL_TABLET | Freq: Four times a day (QID) | ORAL | 0 refills | Status: DC | PRN

## 2020-10-06 MED ORDER — PROMETHAZINE HCL 25 MG PO TABS: 25.0000 mg | ORAL_TABLET | Freq: Three times a day (TID) | ORAL | 0 refills | Status: DC | PRN

## 2020-10-06 MED ORDER — CEPHALEXIN 500 MG PO CAPS: 500.0000 mg | ORAL_CAPSULE | Freq: Three times a day (TID) | ORAL | 0 refills | Status: DC

## 2020-10-06 MED FILL — PROMETHAZINE 25 MG TABLET: 25 | 6 days supply | Qty: 20 | Fill #0

## 2020-10-06 MED FILL — CEPHALEXIN 500 MG CAPSULE: 500 | 7 days supply | Qty: 21 | Fill #0

## 2020-10-06 MED FILL — OXYCODONE-APAP 5-325MG: 5-325 | 3 days supply | Qty: 25 | Fill #0

## 2020-10-06 NOTE — Progress Notes (Signed)
Postop medications sent 

## 2020-10-08 ENCOUNTER — Telehealth: Payer: Self-pay | Admitting: *Deleted

## 2020-10-08 NOTE — Telephone Encounter (Signed)
Called and spoke with the patient and stated that I was calling to see how the patient was doing after surgery with Dr Jacqualyn Posey on 10/06/2020 and patient stated that the nerve block wore off and could wiggle the toes and the color was warm and was icing and elevating and there was not any fever or chills and not any nausea and I stated to call the office if any concerns or questions. Sonya Munoz

## 2020-10-11 ENCOUNTER — Ambulatory Visit (INDEPENDENT_AMBULATORY_CARE_PROVIDER_SITE_OTHER): Payer: 59 | Admitting: Podiatry

## 2020-10-11 ENCOUNTER — Other Ambulatory Visit: Payer: Self-pay

## 2020-10-11 ENCOUNTER — Ambulatory Visit (INDEPENDENT_AMBULATORY_CARE_PROVIDER_SITE_OTHER): Payer: 59

## 2020-10-11 ENCOUNTER — Encounter: Payer: Self-pay | Admitting: Podiatry

## 2020-10-11 DIAGNOSIS — M2061 Acquired deformities of toe(s), unspecified, right foot: Secondary | ICD-10-CM

## 2020-10-11 DIAGNOSIS — M2021 Hallux rigidus, right foot: Secondary | ICD-10-CM

## 2020-10-11 NOTE — Progress Notes (Signed)
Subjective: Sonya Munoz is a 54 y.o. is seen today in office s/p right 1st MTPJ arthrodesis preformed on 10/06/2020.  States that she has been doing well.  She has been taking pain medication as needed.  She has been nonweightbearing.  Denies any systemic complaints such as fevers, chills, nausea, vomiting. No calf pain, chest pain, shortness of breath.   Objective: General: No acute distress, AAOx3  DP/PT pulses palpable 2/4, CRT < 3 sec to all digits.  Protective sensation intact. Motor function intact.  Left foot: Incision is well coapted without any evidence of dehiscence with sutures intact. There is no surrounding erythema, ascending cellulitis, fluctuance, crepitus, malodor, drainage/purulence. There is mild edema around the surgical site. There is minimal pain along the surgical site.  No other areas of tenderness to bilateral lower extremities.  No other open lesions or pre-ulcerative lesions.  No pain with calf compression, swelling, warmth, erythema.   Assessment and Plan:  Status post left foot first MPJ arthrodesis, doing well with no complications   -Treatment options discussed including all alternatives, risks, and complications -X-rays obtained reviewed.  No evidence of acute fracture.  Hardware intact. -Antibiotic ointment was applied followed by dressing.  Keep the dressing clean, dry, intact. -Remain nonweightbearing in the cam boot at all times. -Ice/elevation -Pain medication as needed. -Monitor for any clinical signs or symptoms of infection and DVT/PE and directed to call the office immediately should any occur or go to the ER. -Follow-up as scheduled or sooner if any problems arise. In the meantime, encouraged to call the office with any questions, concerns, change in symptoms.   Celesta Gentile, DPM

## 2020-10-19 ENCOUNTER — Other Ambulatory Visit (HOSPITAL_COMMUNITY): Payer: Self-pay | Admitting: Family Medicine

## 2020-10-19 MED FILL — PREMPRO 0.3 MG-1.5 MG TAB: 0.3-1.5 | 84 days supply | Qty: 84 | Fill #0

## 2020-10-21 ENCOUNTER — Ambulatory Visit (INDEPENDENT_AMBULATORY_CARE_PROVIDER_SITE_OTHER): Payer: 59 | Admitting: Podiatry

## 2020-10-21 ENCOUNTER — Encounter: Payer: Self-pay | Admitting: Podiatry

## 2020-10-21 ENCOUNTER — Other Ambulatory Visit: Payer: Self-pay

## 2020-10-21 DIAGNOSIS — M2021 Hallux rigidus, right foot: Secondary | ICD-10-CM

## 2020-10-21 NOTE — Progress Notes (Signed)
Subjective: Sonya Munoz is a 54 y.o. is seen today in office s/p right 1st MTPJ arthrodesis preformed on 10/06/2020.  She has been doing well not have any significant pain.  Her only concern is that occasionally she will get a "cold" sensation to the leg.  She does feel her pulse and she has a good pulse.  She is not sure this is due to immobility.  Otherwise has been doing well.  Denies any fevers, chills, nausea, vomiting.  No calf pain, chest pain, shortness of breath.    Objective: General: No acute distress, AAOx3  DP/PT pulses palpable 2/4, CRT < 3 sec to all digits.  Protective sensation intact. Motor function intact.  Left foot: Incision is well coapted without any evidence of dehiscence with sutures intact.  There is mild surrounding edema but there is no erythema or drainage or pus or any signs of infection.  No significant tenderness elicited today.  The toes in rectus position.  No other areas of tenderness to bilateral lower extremities.  No other open lesions or pre-ulcerative lesions.  No pain with calf compression, swelling, warmth, erythema.   Assessment and Plan:  Status post left foot first MPJ arthrodesis, doing well with no complications   -Treatment options discussed including all alternatives, risks, and complications -At this time she can start to shower and wash the incision.  Dry thoroughly and apply a small amount of antibiotic ointment followed by dressing. -Recommend continue nonweightbearing cam boot. -Ice elevation -The sensation she is having to her leg is due to immobility.  She has good palpable pulses assessment temperature gradient equal bilaterally today. -Monitor for any clinical signs or symptoms of infection and DVT/PE and directed to call the office immediately should any occur or go to the ER. -Follow-up as scheduled or sooner if any problems arise. In the meantime, encouraged to call the office with any questions, concerns, change in symptoms.    *X-ray next appointment  Ovid Curd, DPM

## 2020-10-25 ENCOUNTER — Encounter: Payer: Self-pay | Admitting: Podiatry

## 2020-11-04 ENCOUNTER — Ambulatory Visit (INDEPENDENT_AMBULATORY_CARE_PROVIDER_SITE_OTHER): Payer: 59 | Admitting: Podiatry

## 2020-11-04 ENCOUNTER — Encounter: Payer: Self-pay | Admitting: Podiatry

## 2020-11-04 ENCOUNTER — Ambulatory Visit (INDEPENDENT_AMBULATORY_CARE_PROVIDER_SITE_OTHER): Payer: 59

## 2020-11-04 ENCOUNTER — Other Ambulatory Visit: Payer: Self-pay

## 2020-11-04 DIAGNOSIS — M2021 Hallux rigidus, right foot: Secondary | ICD-10-CM | POA: Diagnosis not present

## 2020-11-04 NOTE — Progress Notes (Signed)
Subjective: Sonya Munoz is a 55 y.o. is seen today in office s/p right 1st MTPJ arthrodesis preformed on 10/06/2020.  States that she is feeling better.  She was getting some sharp pains previously but this has much improved.  She still nonweightbearing in cam boot.  Her only concern is that she does notice some purple discoloration at times around the surgery since she has not been putting any weight on her foot.  She does have a possible history of Raynaud's that she states.  The cold sensation that she is feeling on the right leg is much improved as well.  Otherwise has been doing well.  Denies any fevers, chills, nausea, vomiting.  No calf pain, chest pain, shortness of breath.    Objective: General: No acute distress, AAOx3  DP/PT pulses palpable 2/4, CRT < 3 sec to all digits.  Protective sensation intact. Motor function intact.  Left foot: Incision is well coapted without any evidence of dehiscence with sutures intact.  There is mild surrounding edema but there is no erythema or drainage or pus or any signs of infection.  There is a slight purple discoloration around the incision which is blanchable.  There is no erythema and there is no warmth.  No drainage or pus.  She has a similar area of purple discoloration on her left big toe as well.  No other open lesions or pre-ulcerative lesions.  No pain with calf compression, swelling, warmth, erythema.   Assessment and Plan:  Status post left foot first MPJ arthrodesis, doing well with no complications; likely Raynaud's  -Treatment options discussed including all alternatives, risks, and complications -X-rays obtained and reviewed.  Hardware intact.  Increased consolidation across the arthrodesis site.  Excess bone graft is noted.  No evidence of acute fracture complicating factors in the hardware -At this time discussed with her she can start to transition to partial weightbearing in the cam boot.  Continue elevation.  Okay to hold off on  ice for the discoloration.  Discussed keeping the feet warm.  If the right hallux is progressing discussed we can start low-dose calcium channel blocker if cleared by her primary care physician for this. -She is going to try to go back to work.  Encouraged elevation.  No was provided at this  Trula Slade DPM

## 2020-11-18 ENCOUNTER — Encounter: Payer: Self-pay | Admitting: Podiatry

## 2020-11-18 ENCOUNTER — Ambulatory Visit (INDEPENDENT_AMBULATORY_CARE_PROVIDER_SITE_OTHER): Payer: 59

## 2020-11-18 ENCOUNTER — Other Ambulatory Visit: Payer: Self-pay

## 2020-11-18 ENCOUNTER — Ambulatory Visit (INDEPENDENT_AMBULATORY_CARE_PROVIDER_SITE_OTHER): Payer: 59 | Admitting: Podiatry

## 2020-11-18 DIAGNOSIS — M2021 Hallux rigidus, right foot: Secondary | ICD-10-CM

## 2020-11-18 DIAGNOSIS — M2061 Acquired deformities of toe(s), unspecified, right foot: Secondary | ICD-10-CM

## 2020-11-21 NOTE — Progress Notes (Signed)
Subjective: Sonya Munoz is a 55 y.o. is seen today in office s/p right 1st MTPJ arthrodesis preformed on 10/06/2020. She states that she is doing good and she is not taking any pain medication. She has returned to work and she is walking in the cam boot. She states the swelling and the coloration is much improving. No recent injury or falls or changes otherwise since I last saw her., Denies any fevers, chills, nausea, vomiting. No calf pain, chest pain, shortness of breath.   Objective: General: No acute distress, AAOx3  DP/PT pulses palpable 2/4, CRT < 3 sec to all digits.  Protective sensation intact. Motor function intact.  Left foot: Incision is well coapted without any evidence of dehiscence and scars point. There is 1 suture on the proximal portion incision. There is no drainage or pus coming from the area there is faint erythema, but no erythema present. This is likely more from inflammation as opposed to infection there is no warmth. Swelling is improved. There is no open lesions otherwise. No pain with calf compression, swelling, warmth, erythema.   Assessment and Plan:  Status post left foot first MPJ arthrodesis, doing well with no complications; likely Raynaud's  -Treatment options discussed including all alternatives, risks, and complications -X-rays obtained reviewed. Increase consolidation of the first MPJ arthrodesis site. -I removed the proximal suture. Antibiotic ointment and a dressing daily. Monitoring signs or symptoms of infection. -Remain in cam boot, weight-bear as tolerated. Discussed that she can slowly transition back into a regular sneaker as able but for now given the still having mild swelling not able to wear regular shoe. Continue ice elevate. Compression anklet.  Trula Slade DPM

## 2020-11-30 MED FILL — LISINOPRIL 10 MG TABS: 10 | 90 days supply | Qty: 90 | Fill #1

## 2020-12-06 ENCOUNTER — Other Ambulatory Visit: Payer: Self-pay

## 2020-12-06 ENCOUNTER — Ambulatory Visit (INDEPENDENT_AMBULATORY_CARE_PROVIDER_SITE_OTHER): Payer: 59 | Admitting: Podiatry

## 2020-12-06 ENCOUNTER — Ambulatory Visit (INDEPENDENT_AMBULATORY_CARE_PROVIDER_SITE_OTHER): Payer: 59

## 2020-12-06 DIAGNOSIS — M2021 Hallux rigidus, right foot: Secondary | ICD-10-CM | POA: Diagnosis not present

## 2020-12-08 NOTE — Progress Notes (Signed)
Subjective: Sonya Munoz is a 55 y.o. is seen today in office s/p right 1st MTPJ arthrodesis preformed on 10/06/2020.  She presents today wearing a sneaker.  She says overall she is feeling better and the swelling and the discomfort is improved.  The color is more normal as well.  No recent injury or falls or changes otherwise.  She has no other concerns today. Denies any fevers, chills, nausea, vomiting. No calf pain, chest pain, shortness of breath.   Objective: General: No acute distress, AAOx3  DP/PT pulses palpable 2/4, CRT < 3 sec to all digits.  Protective sensation intact. Motor function intact.  Left foot: Incision is well coapted without any evidence of dehiscence and scars have formed.  There is decreased edema there is no erythema or warmth.  There is no significant tenderness palpation.  There is no fluctuance crepitation or any signs of infection. No pain with calf compression, swelling, warmth, erythema.   Assessment and Plan:  Status post left foot first MPJ arthrodesis, doing well with no complications  -Treatment options discussed including all alternatives, risks, and complications -X-rays obtained reviewed.  Increase consolidation across the arthrodesis site with hardware intact without any complicating factors. -At this point continue with wearing a regular shoe with next couple of weeks before trying other shoes.  Continue with ice elevation as well as compression.  Gradual increase activity level.  Return in about 4 weeks (around 01/03/2021). X-ray next appointment   Trula Slade DPM

## 2021-01-04 ENCOUNTER — Ambulatory Visit (INDEPENDENT_AMBULATORY_CARE_PROVIDER_SITE_OTHER): Payer: 59 | Admitting: Podiatry

## 2021-01-04 ENCOUNTER — Ambulatory Visit (INDEPENDENT_AMBULATORY_CARE_PROVIDER_SITE_OTHER): Payer: 59

## 2021-01-04 ENCOUNTER — Other Ambulatory Visit: Payer: Self-pay

## 2021-01-04 DIAGNOSIS — M2021 Hallux rigidus, right foot: Secondary | ICD-10-CM

## 2021-01-04 DIAGNOSIS — M2061 Acquired deformities of toe(s), unspecified, right foot: Secondary | ICD-10-CM

## 2021-01-11 NOTE — Progress Notes (Signed)
Subjective: Sonya Munoz is a 55 y.o. is seen today in office s/p right 1st MTPJ arthrodesis preformed on 10/06/2020.  She presents today wearing a sneaker and states that she is doing well.  She denies any pain or significant swelling.  She thinks this will be her last visit with [place] since she is doing well.  Denies any recent injury or falls.  She has no new concerns.  No fevers, chills, nausea, vomiting.  No calf pain, chest pain, shortness of breath.  Objective: General: No acute distress, AAOx3  DP/PT pulses palpable 2/4, CRT < 3 sec to all digits.  Protective sensation intact. Motor function intact.  Left foot: Incision is well coapted without any evidence of dehiscence and scars have formed.  There is minimal edema there is no erythema or warmth.  There is no significant tenderness palpation.  There is no fluctuance crepitation or any signs of infection.  Arthrodesis site appears to be stable. No pain with calf compression, swelling, warmth, erythema.   Assessment and Plan:  Status post left foot first MPJ arthrodesis, doing well with no complications  -Treatment options discussed including all alternatives, risks, and complications -X-rays obtained reviewed.  Increase consolidation across the arthrodesis site with hardware intact without any complicating factors. -He has been wearing regular shoes without any issues.  Discussed gradual increase activity level as tolerated.  Continue with compression anklet as needed to help with postoperative edema. -At this point we will discharge her from the postoperative care and she agrees with this plan has no further questions or concerns.  Encouraged to call for any changes.  Trula Slade DPM

## 2021-01-13 ENCOUNTER — Other Ambulatory Visit: Payer: Self-pay

## 2021-01-13 ENCOUNTER — Encounter: Payer: Self-pay | Admitting: Physical Therapy

## 2021-01-13 ENCOUNTER — Ambulatory Visit: Payer: 59 | Attending: Family Medicine | Admitting: Physical Therapy

## 2021-01-13 DIAGNOSIS — M542 Cervicalgia: Secondary | ICD-10-CM | POA: Diagnosis not present

## 2021-01-13 DIAGNOSIS — R252 Cramp and spasm: Secondary | ICD-10-CM | POA: Diagnosis not present

## 2021-01-13 DIAGNOSIS — R29898 Other symptoms and signs involving the musculoskeletal system: Secondary | ICD-10-CM | POA: Insufficient documentation

## 2021-01-13 DIAGNOSIS — M79605 Pain in left leg: Secondary | ICD-10-CM | POA: Insufficient documentation

## 2021-01-13 NOTE — Therapy (Signed)
[place], [place], [address] Phone: [phone]   Fax:  [phone]  Physical Therapy Evaluation  Patient Details  Name: Sonya Munoz MRN: [id number] Date of Birth: [birth year] Referring Provider (PT): Marda Stalker MD   Encounter Date: 01/13/2021   PT End of Session - 01/13/21 1457    Visit Number 1    Number of Visits 6    Date for PT Re-Evaluation 02/24/21    Authorization Type [facility]    PT Start Time 0804    PT Stop Time 0845    PT Time Calculation (min) 41 min    Activity Tolerance Patient tolerated treatment well    Behavior During Therapy [facility] for tasks assessed/performed           Past Medical History:  Diagnosis Date  . Anxiety and depression   . GERD (gastroesophageal reflux disease)   . Insomnia     Past Surgical History:  Procedure Laterality Date  . AUGMENTATION MAMMAPLASTY Bilateral 1998   saline   . BREAST SURGERY Bilateral    augmentation  . fallopian tube repair    . RHINOPLASTY    . SHOULDER OPEN ROTATOR CUFF REPAIR Left     There were no vitals filed for this visit.    Subjective Assessment - 01/13/21 0814    Subjective Patient has had pain in her hamstring with deadlifts and when walking up hill for several months now. The pain is just there with activity but hasnot changed much over some time. She has tried light stretching and nothing has owkred. She returned to working out and that did not change much.    Pertinent History right shoulder pain;    Limitations --   gym activity   How long can you sit comfortably? Sits for long periods during the day    How long can you stand comfortably? limited distances    How long can you walk comfortably? walking up hill and distances cause pain    Diagnostic tests nothing    Patient Stated Goals to have less pain    Currently in Pain? Yes    Pain Score 5    with deadlifting and walking uphill   Pain Orientation Left     Pain Descriptors / Indicators Aching    Pain Type Chronic pain    Pain Onset More than a month ago    Pain Frequency Constant    Aggravating Factors  deadlifts; going up hill    Pain Relieving Factors rest    Effect of Pain on Daily Activities limiting ability to exercise              [facility] PT Assessment - 01/13/21 0001      Assessment   Medical Diagnosis Hamsting strain    Referring Provider (PT) Marda Stalker MD    Onset Date/Surgical Date --   Several months ago   Hand Dominance Right    Next MD Visit Nothing scheduled    Prior Therapy for her shoulder      Precautions   Precautions None      Restrictions   Weight Bearing Restrictions No      Balance Screen   Has the patient fallen in the past 6 months No    Has the patient had a decrease in activity level because of a fear of falling?  No    Is the patient reluctant to leave their home because of a fear of falling?  No      Home Environment   Additional Comments No steps into the house      Prior Function   Level of Independence Independent    Vocation Full time employment      Cognition   Overall Cognitive Status Within Functional Limits for tasks assessed    Attention Focused    Focused Attention Appears intact    Memory Appears intact    Awareness Appears intact    Problem Solving Appears intact      Observation/Other Assessments   Focus on Therapeutic Outcomes (FOTO)  64% limitation 80% limitation      Sensation   Light Touch Appears Intact      Coordination   Gross Motor Movements are Fluid and Coordinated Yes    Fine Motor Movements are Fluid and Coordinated Yes      Functional Tests   Functional tests Squat;Single Leg Squat      Squat   Comments good squat technique      Single Leg Squat   Comments right mildy worse then left      Flexibility   Soft Tissue Assessment /Muscle Length yes    Hamstrings 100 degrees right; 90 left with mild pain                       Objective measurements completed on examination: See above findings.       [facility] Adult PT Treatment/Exercise - 01/13/21 0001      Knee/Hip Exercises: Stretches   Active Hamstring Stretch Limitations 3x20 sec hold reviewed low intensity stretching for work      Knee/Hip Exercises: Standing   Other Standing Knee Exercises star drill x5    Other Standing Knee Exercises cone dsrill from raised surface x10      Knee/Hip Exercises: Supine   Other Supine Knee/Hip Exercises bridge 2x10                  PT Education - 01/13/21 1456    Education Details HEP and symptom management; benefits and risk of TPDN    Person(s) Educated Patient    Methods Demonstration;Tactile cues;Explanation;Verbal cues    Comprehension Verbalized understanding;Returned demonstration;Verbal cues required;Tactile cues required;Need further instruction               PT Long Term Goals - 01/13/21 1652      PT LONG TERM GOAL #1   Title Patient will return to dead lifting without pain    Time 6    Period Weeks    Status New    Target Date 02/24/21      PT LONG TERM GOAL #2   Title Patient will return to walking up hills without pain    Time 6    Period Weeks    Status New    Target Date 02/24/21      PT LONG TERM GOAL #3   Title Patient will demonstrate an 80% ability on FOTO    Time 6    Period Weeks    Status New    Target Date 02/24/21                  Plan - 01/13/21 1459    Clinical Impression Statement Patient is a 55 year old female with left hamstring pain with activity such as dead lifting and walking up-hill. 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 She has limited left hamstring length compared to the right. She  has tenderness to palpation in the upper medial left hamstring. Therapy perfromed needling on medial hamstring today with a good twitch respose. She was givenlight exercises to perfrom at home. She would benefit from skilled  therapy.    Personal Factors and Comorbidities Time since onset of injury/illness/exacerbation    Examination-Activity Limitations Lift;Locomotion Level;Stairs;Squat;Stand    Examination-Participation Restrictions Community Activity;Medication Management;School;Occupation    Stability/Clinical Decision Making Evolving/Moderate complexity    Clinical Decision Making Low    Rehab Potential Excellent    PT Frequency 1x / week    PT Duration 6 weeks    PT Treatment/Interventions ADLs/Self Care Home Management;Electrical Stimulation;Cryotherapy;Iontophoresis 4mg /ml Dexamethasone;Moist Heat;Traction;Ultrasound;Gait training;DME Instruction;Functional mobility training;Therapeutic activities;Therapeutic exercise;Neuromuscular re-education;Patient/family education;Manual techniques;Scar mobilization;Passive range of motion;Dry needling;Taping    PT Next Visit Plan continue with trigger point dry needling; continue with eccentric loading of the hamstring; reviewe deadlifts form elevated place    PT Home Exercise Plan Access Code: [email or web address]  URL: [email or web address]  Date: 01/13/2021  Prepared by: Carolyne Littles    Exercises  Seated Hamstring Stretch - 1 x daily - 7 x weekly - 3 sets - 3 reps - 20 hold  The Diver - 1 x daily - 7 x weekly - 3 sets - 10 reps  Single Leg Balance with Opposite Leg Star Reach - 1 x daily - 7 x weekly - 3 sets - 5 reps  Supine Bridge - 1 x daily - 7 x weekly - 3 sets - 10 reps    Consulted and Agree with Plan of Care Patient           Patient will benefit from skilled therapeutic intervention in order to improve the following deficits and impairments:  Abnormal gait,Pain,Increased muscle spasms,Increased fascial restricitons,Decreased range of motion,Decreased mobility  Visit Diagnosis: Pain in left leg  Cramp and spasm     Problem List Patient Active Problem List   Diagnosis Date Noted  . Hallux rigidus of right foot 11/04/2020  . Right shoulder pain  12/27/2017    Carney Living PT DPT  01/13/2021, 5:04 PM  [facility] [address] [place], [place], [address] Phone: [phone]   Fax:  [phone]  Name: Adelfa Lozito MRN: [id number] Date of Birth: [birth year]

## 2021-01-13 NOTE — Patient Instructions (Signed)
Access Code: [email or web address] URL: [email or web address] Date: 01/13/2021 Prepared by: Carolyne Littles  Exercises Seated Hamstring Stretch - 1 x daily - 7 x weekly - 3 sets - 3 reps - 20 hold The Diver - 1 x daily - 7 x weekly - 3 sets - 10 reps Single Leg Balance with Opposite Leg Star Reach - 1 x daily - 7 x weekly - 3 sets - 5 reps Supine Bridge - 1 x daily - 7 x weekly - 3 sets - 10 reps

## 2021-01-18 MED FILL — PREMPRO 0.3 MG-1.5 MG TAB: 0.3-1.5 | 28 days supply | Qty: 28 | Fill #1

## 2021-01-18 MED FILL — SERTRALINE HCL 50 MG TABS: 50 | 90 days supply | Qty: 90 | Fill #2

## 2021-01-20 ENCOUNTER — Ambulatory Visit: Payer: 59 | Admitting: Physical Therapy

## 2021-01-20 ENCOUNTER — Other Ambulatory Visit: Payer: Self-pay

## 2021-01-20 ENCOUNTER — Encounter: Payer: Self-pay | Admitting: Physical Therapy

## 2021-01-20 DIAGNOSIS — M79605 Pain in left leg: Secondary | ICD-10-CM

## 2021-01-20 DIAGNOSIS — R29898 Other symptoms and signs involving the musculoskeletal system: Secondary | ICD-10-CM

## 2021-01-20 DIAGNOSIS — M542 Cervicalgia: Secondary | ICD-10-CM | POA: Diagnosis not present

## 2021-01-20 DIAGNOSIS — R252 Cramp and spasm: Secondary | ICD-10-CM | POA: Diagnosis not present

## 2021-01-21 NOTE — Therapy (Signed)
[place], [place], [address] Phone: [phone]   Fax:  [phone]  Physical Therapy Treatment  Patient Details  Name: Sonya Munoz MRN: [id number] Date of Birth: October 17, 1966 Referring Provider (PT): Marda Stalker MD   Encounter Date: 01/20/2021   PT End of Session - 01/21/21 0758    Visit Number 2    Number of Visits 6    Date for PT Re-Evaluation 02/24/21    Authorization Type [facility] UMR    PT Start Time 1500    PT Stop Time 1543    PT Time Calculation (min) 43 min    Activity Tolerance Patient tolerated treatment well    Behavior During Therapy [facility] for tasks assessed/performed           Past Medical History:  Diagnosis Date  . Anxiety and depression   . GERD (gastroesophageal reflux disease)   . Insomnia     Past Surgical History:  Procedure Laterality Date  . AUGMENTATION MAMMAPLASTY Bilateral 1998   saline   . BREAST SURGERY Bilateral    augmentation  . fallopian tube repair    . RHINOPLASTY    . SHOULDER OPEN ROTATOR CUFF REPAIR Left     There were no vitals filed for this visit.   Subjective Assessment - 01/21/21 0756    Subjective Patient reports no real change. She has been working on her exerciss and stretches.She feels it more when she is sitting.    Pertinent History right shoulder pain;    How long can you sit comfortably? Sits for long periods during the day    How long can you stand comfortably? limited distances    How long can you walk comfortably? walking up hill and distances cause pain    Diagnostic tests nothing    Patient Stated Goals to have less pain    Currently in Pain? No/denies                             [facility] Adult PT Treatment/Exercise - 01/21/21 0001      Knee/Hip Exercises: Standing   Other Standing Knee Exercises rebounder 2x15 red ball; eccentric touch down pain with 4 inch; no pain with 2 inch; tremill stop the belt .3 x  2 min;      Knee/Hip Exercises: Supine   Other Supine Knee/Hip Exercises brdige on ball x20; double knee to chest with cuing for intensity x20      Manual Therapy   Manual Therapy Soft tissue mobilization    Manual therapy comments skilled palpation of soft tissue during DN    Soft tissue mobilization to high hamstring area                  PT Education - 01/21/21 0758    Education Details Reviewed HEP and symptom mangement    Person(s) Educated Patient    Methods Explanation;Tactile cues;Verbal cues;Demonstration    Comprehension Returned demonstration;Verbal cues required;Tactile cues required;Verbalized understanding               PT Long Term Goals - 01/13/21 1652      PT LONG TERM GOAL #1   Title Patient will return to dead lifting without pain    Time 6    Period Weeks    Status New    Target Date 02/24/21      PT LONG TERM GOAL #2   Title Patient will  return to walking up hills without pain    Time 6    Period Weeks    Status New    Target Date 02/24/21      PT LONG TERM GOAL #3   Title Patient will demonstrate an 80% ability on FOTO    Time 6    Period Weeks    Status New    Target Date 02/24/21                 Plan - 01/20/21 1537    Clinical Impression Statement Patient continues to have a trigger poinin her high medial hamstring. She tolerated needling well. She had a good twitch response with all needles. Therapy perfromed deep trigger point release to the area. We advanced her exercises. She was advised to do harder exercises 3x a week. We will continue to advance as tolerated.    Personal Factors and Comorbidities Time since onset of injury/illness/exacerbation    Examination-Activity Limitations Lift;Locomotion Level;Stairs;Squat;Stand    Examination-Participation Restrictions Community Activity;Medication Management;School;Occupation    Stability/Clinical Decision Making Evolving/Moderate complexity    Clinical Decision Making Low     Rehab Potential Excellent    PT Frequency 1x / week    PT Duration 6 weeks    PT Treatment/Interventions ADLs/Self Care Home Management;Electrical Stimulation;Cryotherapy;Iontophoresis 4mg /ml Dexamethasone;Moist Heat;Traction;Ultrasound;Gait training;DME Instruction;Functional mobility training;Therapeutic activities;Therapeutic exercise;Neuromuscular re-education;Patient/family education;Manual techniques;Scar mobilization;Passive range of motion;Dry needling;Taping    PT Next Visit Plan continue with trigger point dry needling; continue with eccentric loading of the hamstring; reviewe deadlifts form elevated place    PT Home Exercise Plan Access Code: [email or web address]  URL: [email or web address]  Date: 01/13/2021  Prepared by: Carolyne Littles    Exercises  Seated Hamstring Stretch - 1 x daily - 7 x weekly - 3 sets - 3 reps - 20 hold  The Diver - 1 x daily - 7 x weekly - 3 sets - 10 reps  Single Leg Balance with Opposite Leg Star Reach - 1 x daily - 7 x weekly - 3 sets - 5 reps  Supine Bridge - 1 x daily - 7 x weekly - 3 sets - 10 reps    Consulted and Agree with Plan of Care Patient           Patient will benefit from skilled therapeutic intervention in order to improve the following deficits and impairments:  Abnormal gait,Pain,Increased muscle spasms,Increased fascial restricitons,Decreased range of motion,Decreased mobility  Visit Diagnosis: Pain in left leg  Cramp and spasm  Cervicalgia  Other symptoms and signs involving the musculoskeletal system     Problem List Patient Active Problem List   Diagnosis Date Noted  . Hallux rigidus of right foot 11/04/2020  . Right shoulder pain 12/27/2017    Carney Living PT DPT  01/21/2021, 9:25 AM  [facility] [address] [place], [place], [address] Phone: [phone]   Fax:  [phone]  Name: Sonya Munoz MRN: [id number] Date of Birth: 11/23/1965

## 2021-02-03 ENCOUNTER — Ambulatory Visit: Payer: 59 | Attending: Family Medicine | Admitting: Physical Therapy

## 2021-02-03 ENCOUNTER — Other Ambulatory Visit: Payer: Self-pay

## 2021-02-03 DIAGNOSIS — R29898 Other symptoms and signs involving the musculoskeletal system: Secondary | ICD-10-CM | POA: Insufficient documentation

## 2021-02-03 DIAGNOSIS — R293 Abnormal posture: Secondary | ICD-10-CM | POA: Insufficient documentation

## 2021-02-03 DIAGNOSIS — M79605 Pain in left leg: Secondary | ICD-10-CM | POA: Insufficient documentation

## 2021-02-03 DIAGNOSIS — R252 Cramp and spasm: Secondary | ICD-10-CM | POA: Diagnosis not present

## 2021-02-03 DIAGNOSIS — M542 Cervicalgia: Secondary | ICD-10-CM | POA: Diagnosis not present

## 2021-02-04 ENCOUNTER — Encounter: Payer: Self-pay | Admitting: Physical Therapy

## 2021-02-04 NOTE — Therapy (Signed)
Sonya Munoz, [place], [address] Phone: [phone]   Fax:  [phone]  Physical Therapy Treatment  Patient Details  Name: Sonya Munoz MRN: [id number] Date of Birth: 1966-08-17 Referring Provider (PT): Marda Stalker MD   Encounter Date: 02/03/2021   PT End of Session - 02/04/21 0751    Visit Number 3    Number of Visits 6    Date for PT Re-Evaluation 02/24/21    Authorization Type [facility] U[facility]    PT Start Time 8338    PT Stop Time 1628    PT Time Calculation (min) 43 min    Activity Tolerance Patient tolerated treatment well    Behavior During Therapy [facility] for tasks assessed/performed           Past Medical History:  Diagnosis Date  . Anxiety and depression   . GERD (gastroesophageal reflux disease)   . Insomnia     Past Surgical History:  Procedure Laterality Date  . AUGMENTATION MAMMAPLASTY Bilateral 1998   saline   . BREAST SURGERY Bilateral    augmentation  . fallopian tube repair    . RHINOPLASTY    . SHOULDER OPEN ROTATOR CUFF REPAIR Left     There were no vitals filed for this visit.   Subjective Assessment - 02/04/21 0748    Subjective Patient reports she had some imporvement but then caught her foot and had a return in pain. She also sat for 2-3 days and had some tightness. she has been working on her stretches and exercises. She feels not that the pain is more around her obturator area. Therapy will assess.    Pertinent History right shoulder pain;    How long can you sit comfortably? Sits for long periods during the day    How long can you stand comfortably? limited distances    How long can you walk comfortably? walking up hill and distances cause pain    Diagnostic tests nothing    Patient Stated Goals to have less pain    Currently in Pain? No/denies   no pain without activity                            [facility] Adult PT Treatment/Exercise - 02/04/21  0001      Knee/Hip Exercises: Standing   Other Standing Knee Exercises dead lift 3x10 15lbs with cuing for technique from 8 inch step      Manual Therapy   Manual Therapy Soft tissue mobilization    Manual therapy comments skilled palpation of soft tissue during DN    Soft tissue mobilization to high hamstring area; IASTM to proximal gluteal            Trigger Point Dry Needling - 02/04/21 0001    Consent Given? Yes    Education Handout Provided Yes    Muscles Treated Lower Quadrant Hamstring    Dry Needling Comments 3 needles to proximal hanmstring    Hamstring Response Palpable increased muscle length;Twitch response elicited                PT Education - 02/04/21 0750    Education Details reviewed self stretching    Person(s) Educated Patient    Methods Explanation;Demonstration;Tactile cues;Verbal cues    Comprehension Verbalized understanding;Returned demonstration;Tactile cues required;Verbal cues required               PT Long Term Goals - 02/04/21 0754  PT LONG TERM GOAL #1   Title Patient will return to dead lifting without pain    Baseline still having pulling    Time 6    Period Weeks    Status On-going      PT LONG TERM GOAL #2   Title Patient will return to walking up hills without pain    Time 6    Period Weeks    Status On-going      PT LONG TERM GOAL #3   Title Patient will demonstrate an 80% ability on FOTO    Period Weeks    Status On-going                 Plan - 02/04/21 0751    Clinical Impression Statement Therapy palpated obtruator area and found no major trigger points. SWhe did have a trigger point around her proximal hamstring area. We needled that spot again and perfromed IASTYM. Therapy reviewed technique with dead lifts. She did well but felt a little area of pulling. We will continue to work on stretching and strengthening program. Therapy will consider needling the obtruator if pain continues to be in the medial  gluteal fold.    Personal Factors and Comorbidities Time since onset of injury/illness/exacerbation    Examination-Activity Limitations Lift;Locomotion Level;Stairs;Squat;Stand    Examination-Participation Restrictions Community Activity;Medication Management;School;Occupation    Stability/Clinical Decision Making Evolving/Moderate complexity    Clinical Decision Making Low    Rehab Potential Excellent    PT Frequency 1x / week    PT Treatment/Interventions ADLs/Self Care Home Management;Electrical Stimulation;Cryotherapy;Iontophoresis 4mg /ml Dexamethasone;Moist Heat;Traction;Ultrasound;Gait training;DME Instruction;Functional mobility training;Therapeutic activities;Therapeutic exercise;Neuromuscular re-education;Patient/family education;Manual techniques;Scar mobilization;Passive range of motion;Dry needling;Taping    PT Next Visit Plan continue with trigger point dry needling; continue with eccentric loading of the hamstring; reviewe deadlifts form elevated place    PT Home Exercise Plan Access Code: [email or web address]  URL: [email or web address]  Date: 01/13/2021  Prepared by: Carolyne Littles    Exercises  Seated Hamstring Stretch - 1 x daily - 7 x weekly - 3 sets - 3 reps - 20 hold  The Diver - 1 x daily - 7 x weekly - 3 sets - 10 reps  Single Leg Balance with Opposite Leg Star Reach - 1 x daily - 7 x weekly - 3 sets - 5 reps  Supine Bridge - 1 x daily - 7 x weekly - 3 sets - 10 reps    Consulted and Agree with Plan of Care Patient           Patient will benefit from skilled therapeutic intervention in order to improve the following deficits and impairments:  Abnormal gait,Pain,Increased muscle spasms,Increased fascial restricitons,Decreased range of motion,Decreased mobility  Visit Diagnosis: Pain in left leg  Cramp and spasm     Problem List Patient Active Problem List   Diagnosis Date Noted  . Hallux rigidus of right foot 11/04/2020  . Right shoulder pain 12/27/2017     Carney Living 02/04/2021, 7:58 AM  [facility] [address] [place], [place], [address] Phone: [phone]   Fax:  [phone]  Name: Sonya Munoz MRN: [id number] Date of Birth: 1966-08-26

## 2021-02-09 ENCOUNTER — Ambulatory Visit: Payer: 59

## 2021-02-10 ENCOUNTER — Other Ambulatory Visit: Payer: Self-pay

## 2021-02-10 ENCOUNTER — Ambulatory Visit: Payer: 59 | Admitting: Physical Therapy

## 2021-02-10 DIAGNOSIS — R29898 Other symptoms and signs involving the musculoskeletal system: Secondary | ICD-10-CM

## 2021-02-10 DIAGNOSIS — R252 Cramp and spasm: Secondary | ICD-10-CM

## 2021-02-10 DIAGNOSIS — R293 Abnormal posture: Secondary | ICD-10-CM

## 2021-02-10 DIAGNOSIS — M542 Cervicalgia: Secondary | ICD-10-CM | POA: Diagnosis not present

## 2021-02-10 DIAGNOSIS — M79605 Pain in left leg: Secondary | ICD-10-CM | POA: Diagnosis not present

## 2021-02-10 NOTE — Therapy (Signed)
[place], [place], [address] Phone: [phone]   Fax:  [phone]  Physical Therapy Treatment  Patient Details  Name: Sonya Munoz MRN: [id number] Date of Birth: 01-07-1966 Referring Provider (PT): Marda Stalker MD   Encounter Date: 02/10/2021   PT End of Session - 02/10/21 1701    Visit Number 4    Number of Visits 6    Date for PT Re-Evaluation 02/24/21    Authorization Type [facility] U[facility]    PT Start Time 2536   Patient 12 minutes late   PT Stop Time 1630    PT Time Calculation (min) 33 min    Activity Tolerance Patient tolerated treatment well    Behavior During Therapy [facility] for tasks assessed/performed           Past Medical History:  Diagnosis Date  . Anxiety and depression   . GERD (gastroesophageal reflux disease)   . Insomnia     Past Surgical History:  Procedure Laterality Date  . AUGMENTATION MAMMAPLASTY Bilateral 1998   saline   . BREAST SURGERY Bilateral    augmentation  . fallopian tube repair    . RHINOPLASTY    . SHOULDER OPEN ROTATOR CUFF REPAIR Left     There were no vitals filed for this visit.   Subjective Assessment - 02/10/21 1700    Subjective Patient reports she is better but still having some pain with deep suqats. She feelsi like it is in the internal protation of her lower buttock. Therapy will assess.    Pertinent History right shoulder pain;    How long can you sit comfortably? Sits for long periods during the day    How long can you stand comfortably? limited distances    How long can you walk comfortably? walking up hill and distances cause pain    Diagnostic tests nothing    Patient Stated Goals to have less pain    Currently in Pain? No/denies   only with certain movements                            [facility] Adult PT Treatment/Exercise - 02/10/21 0001      Knee/Hip Exercises: Standing   Other Standing Knee Exercises single leg  forward reach 3x12; hamstring towel slide ectension 3x12      Knee/Hip Exercises: Supine   Other Supine Knee/Hip Exercises supine hamstring extension with UE support 3x12      Manual Therapy   Soft tissue mobilization to high hamstring area; IASTM to proximal gluteal            Trigger Point Dry Needling - 02/10/21 0001    Consent Given? Yes    Education Handout Provided Yes    Muscles Treated Back/Hip Obturator internus    Dry Needling Comments 3 needles in the brtuator; great twitch response ellicited    Obturator internus Response Twitch response elicited                PT Education - 02/10/21 1701    Education Details HEP and symptom mangement    Person(s) Educated Patient    Methods Explanation;Demonstration;Tactile cues;Verbal cues    Comprehension Verbalized understanding;Returned demonstration;Verbal cues required;Tactile cues required               PT Long Term Goals - 02/04/21 0754      PT LONG TERM GOAL #1   Title Patient will return  to dead lifting without pain    Baseline still having pulling    Time 6    Period Weeks    Status On-going      PT LONG TERM GOAL #2   Title Patient will return to walking up hills without pain    Time 6    Period Weeks    Status On-going      PT LONG TERM GOAL #3   Title Patient will demonstrate an 80% ability on FOTO    Period Weeks    Status On-going                 Plan - 02/10/21 1707    Clinical Impression Statement Therapy needled patients obtruator internus today. She had a great twtich respose. Therapy also perfrom trogger point release to the high hamstring area. Her session was limited by time. She perfromed Askling -L series of hamstring exercises. She could feel the exercises but had no signifcant increase in pain.    Personal Factors and Comorbidities Time since onset of injury/illness/exacerbation    Examination-Activity Limitations Lift;Locomotion Level;Stairs;Squat;Stand     Examination-Participation Restrictions Community Activity;Medication Management;School;Occupation    Stability/Clinical Decision Making Evolving/Moderate complexity    Clinical Decision Making Low    Rehab Potential Excellent    PT Frequency 1x / week    PT Treatment/Interventions ADLs/Self Care Home Management;Electrical Stimulation;Cryotherapy;Iontophoresis 4mg /ml Dexamethasone;Moist Heat;Traction;Ultrasound;Gait training;DME Instruction;Functional mobility training;Therapeutic activities;Therapeutic exercise;Neuromuscular re-education;Patient/family education;Manual techniques;Scar mobilization;Passive range of motion;Dry needling;Taping    PT Next Visit Plan continue with trigger point dry needling; continue with eccentric loading of the hamstring; reviewe deadlifts form elevated place    PT Home Exercise Plan Access Code: [email or web address]  URL: [email or web address]  Date: 01/13/2021  Prepared by: Carolyne Littles    Exercises  Seated Hamstring Stretch - 1 x daily - 7 x weekly - 3 sets - 3 reps - 20 hold  The Diver - 1 x daily - 7 x weekly - 3 sets - 10 reps  Single Leg Balance with Opposite Leg Star Reach - 1 x daily - 7 x weekly - 3 sets - 5 reps  Supine Bridge - 1 x daily - 7 x weekly - 3 sets - 10 reps    Consulted and Agree with Plan of Care Patient           Patient will benefit from skilled therapeutic intervention in order to improve the following deficits and impairments:  Abnormal gait,Pain,Increased muscle spasms,Increased fascial restricitons,Decreased range of motion,Decreased mobility  Visit Diagnosis: Pain in left leg  Cramp and spasm  Cervicalgia  Other symptoms and signs involving the musculoskeletal system  Abnormal posture     Problem List Patient Active Problem List   Diagnosis Date Noted  . Hallux rigidus of right foot 11/04/2020  . Right shoulder pain 12/27/2017    Carney Living PT DPT  02/10/2021, 5:13 PM  [facility] [address] [place], [place], [address] Phone: [phone]   Fax:  [phone]  Name: Sonya Munoz MRN: [id number] Date of Birth: 11/08/1965

## 2021-02-12 ENCOUNTER — Other Ambulatory Visit (HOSPITAL_COMMUNITY): Payer: Self-pay

## 2021-02-12 MED FILL — Conjugated Estrogen-Medroxyprogest Acetate Tab 0.3-1.5 MG: ORAL | 28 days supply | Qty: 28 | Fill #0 | Status: AC

## 2021-02-14 ENCOUNTER — Other Ambulatory Visit (HOSPITAL_COMMUNITY): Payer: Self-pay

## 2021-02-14 MED ORDER — LISINOPRIL 10 MG PO TABS
ORAL_TABLET | ORAL | 1 refills | Status: DC
  Filled 2021-02-14: qty 90, 90d supply, fill #0
  Filled 2021-05-31: qty 90, 90d supply, fill #1

## 2021-02-17 ENCOUNTER — Other Ambulatory Visit (HOSPITAL_COMMUNITY): Payer: Self-pay

## 2021-02-22 ENCOUNTER — Other Ambulatory Visit: Payer: Self-pay

## 2021-02-22 ENCOUNTER — Ambulatory Visit (HOSPITAL_BASED_OUTPATIENT_CLINIC_OR_DEPARTMENT_OTHER): Payer: 59 | Attending: Family Medicine | Admitting: Physical Therapy

## 2021-02-22 DIAGNOSIS — M79605 Pain in left leg: Secondary | ICD-10-CM | POA: Insufficient documentation

## 2021-02-22 DIAGNOSIS — M542 Cervicalgia: Secondary | ICD-10-CM | POA: Insufficient documentation

## 2021-02-22 DIAGNOSIS — R252 Cramp and spasm: Secondary | ICD-10-CM | POA: Insufficient documentation

## 2021-02-23 NOTE — Therapy (Signed)
[facility] [address] [place], [place], [address] Phone: [phone]   Fax:  [phone]  Physical Therapy Treatment  Patient Details  Name: Sonya Munoz MRN: [id number] Date of Birth: Feb 01, 1966 Referring Provider (PT): Marda Stalker MD   Encounter Date: 02/22/2021   PT End of Session - 02/23/21 1216    Visit Number 5    Number of Visits 6    Date for PT Re-Evaluation 02/24/21    Authorization Type [facility]    PT Start Time 0845    PT Stop Time 0927    PT Time Calculation (min) 42 min    Activity Tolerance Patient tolerated treatment well    Behavior During Therapy [facility] for tasks assessed/performed           Past Medical History:  Diagnosis Date  . Anxiety and depression   . GERD (gastroesophageal reflux disease)   . Insomnia     Past Surgical History:  Procedure Laterality Date  . AUGMENTATION MAMMAPLASTY Bilateral 1998   saline   . BREAST SURGERY Bilateral    augmentation  . fallopian tube repair    . RHINOPLASTY    . SHOULDER OPEN ROTATOR CUFF REPAIR Left     There were no vitals filed for this visit.   Subjective Assessment - 02/23/21 1214    Subjective Patient continues to have minor pain in her upper thigh and inner buttock. The pain is a bit worse when she does straight leg deadlifts. She is able to do 65 lb non- straight leg deadlifts.    Pertinent History right shoulder pain;    How long can you sit comfortably? Sits for long periods during the day    How long can you stand comfortably? limited distances    How long can you walk comfortably? walking up hill and distances cause pain    Diagnostic tests nothing    Patient Stated Goals to have less pain    Currently in Pain? No/denies   only with certian positions                            [facility] Adult PT Treatment/Exercise - 02/23/21 0001      Knee/Hip Exercises: Standing   Other Standing Knee Exercises treadmill  belt stop 3 min .5    Other Standing Knee Exercises cone drill x20;      Knee/Hip Exercises: Supine   Other Supine Knee/Hip Exercises doubd lkene to chest on ball 2x10; hamstring set on ball x20; bradige on ball x20;      Manual Therapy   Manual therapy comments skilled palpation of soft tissue during DN    Soft tissue mobilization to high hamstring area; IASTM to proximal gluteal            Trigger Point Dry Needling - 02/23/21 0001    Consent Given? Yes    Education Handout Provided Yes    Muscles Treated Back/Hip Obturator internus;Piriformis    Dry Needling Comments 2 needles in obtruator; 2 neeldes in the pirifomis    Piriformis Response Twitch response elicited;Palpable increased muscle length    Obturator internus Response Twitch response elicited                PT Education - 02/23/21 1215    Education Details reviewed HEP    Person(s) Educated Patient    Methods Explanation;Demonstration;Tactile cues;Handout;Verbal cues    Comprehension Returned demonstration;Tactile cues required;Verbalized understanding;Verbal  cues required               PT Long Term Goals - 02/04/21 0754      PT LONG TERM GOAL #1   Title Patient will return to dead lifting without pain    Baseline still having pulling    Time 6    Period Weeks    Status On-going      PT LONG TERM GOAL #2   Title Patient will return to walking up hills without pain    Time 6    Period Weeks    Status On-going      PT LONG TERM GOAL #3   Title Patient will demonstrate an 80% ability on FOTO    Period Weeks    Status On-going                 Plan - 02/23/21 1217    Clinical Impression Statement Patient continues to have lingering pain in the brturator and hamstring area. Overall her pain radating into her hamstring ois about gone. She has retruned to near full activity but the pain is still there with certain activity. Her single leg stability has improved significantly. We will  re-assess her plan next visit and talk about plan going forward.    Personal Factors and Comorbidities Time since onset of injury/illness/exacerbation    Examination-Activity Limitations Lift;Locomotion Level;Stairs;Squat;Stand    Examination-Participation Restrictions Community Activity;Medication Management;School;Occupation    Stability/Clinical Decision Making Evolving/Moderate complexity    Clinical Decision Making Low    Rehab Potential Excellent    PT Frequency 1x / week    PT Duration 6 weeks    PT Treatment/Interventions ADLs/Self Care Home Management;Electrical Stimulation;Cryotherapy;Iontophoresis 4mg /ml Dexamethasone;Moist Heat;Traction;Ultrasound;Gait training;DME Instruction;Functional mobility training;Therapeutic activities;Therapeutic exercise;Neuromuscular re-education;Patient/family education;Manual techniques;Scar mobilization;Passive range of motion;Dry needling;Taping    PT Next Visit Plan continue with trigger point dry needling; continue with eccentric loading of the hamstring; reviewe deadlifts form elevated place    PT Home Exercise Plan Access Code: [email or web address]  URL: [email or web address]  Date: 01/13/2021  Prepared by: Carolyne Littles    Exercises  Seated Hamstring Stretch - 1 x daily - 7 x weekly - 3 sets - 3 reps - 20 hold  The Diver - 1 x daily - 7 x weekly - 3 sets - 10 reps  Single Leg Balance with Opposite Leg Star Reach - 1 x daily - 7 x weekly - 3 sets - 5 reps  Supine Bridge - 1 x daily - 7 x weekly - 3 sets - 10 reps    Consulted and Agree with Plan of Care Patient           Patient will benefit from skilled therapeutic intervention in order to improve the following deficits and impairments:  Abnormal gait,Pain,Increased muscle spasms,Increased fascial restricitons,Decreased range of motion,Decreased mobility  Visit Diagnosis: Pain in left leg  Cramp and spasm     Problem List Patient Active Problem List   Diagnosis Date Noted  . Hallux  rigidus of right foot 11/04/2020  . Right shoulder pain 12/27/2017    Carney Living PT DPT  02/23/2021, 12:20 PM  [facility] [address] [place], [place], [address] Phone: [phone]   Fax:  [phone]  Name: Sonya Munoz MRN: [id number] Date of Birth: [birth year]

## 2021-03-01 DIAGNOSIS — I10 Essential (primary) hypertension: Secondary | ICD-10-CM | POA: Diagnosis not present

## 2021-03-01 DIAGNOSIS — Z0000 Encounter for general adult medical examination without abnormal findings: Secondary | ICD-10-CM | POA: Diagnosis not present

## 2021-03-01 DIAGNOSIS — G4709 Other insomnia: Secondary | ICD-10-CM | POA: Diagnosis not present

## 2021-03-01 DIAGNOSIS — N951 Menopausal and female climacteric states: Secondary | ICD-10-CM | POA: Diagnosis not present

## 2021-03-01 DIAGNOSIS — F418 Other specified anxiety disorders: Secondary | ICD-10-CM | POA: Diagnosis not present

## 2021-03-01 DIAGNOSIS — Z23 Encounter for immunization: Secondary | ICD-10-CM | POA: Diagnosis not present

## 2021-03-08 ENCOUNTER — Ambulatory Visit (HOSPITAL_BASED_OUTPATIENT_CLINIC_OR_DEPARTMENT_OTHER): Payer: 59 | Admitting: Physical Therapy

## 2021-03-08 ENCOUNTER — Other Ambulatory Visit: Payer: Self-pay

## 2021-03-08 DIAGNOSIS — R252 Cramp and spasm: Secondary | ICD-10-CM | POA: Diagnosis not present

## 2021-03-08 DIAGNOSIS — M79605 Pain in left leg: Secondary | ICD-10-CM | POA: Diagnosis not present

## 2021-03-08 DIAGNOSIS — M542 Cervicalgia: Secondary | ICD-10-CM

## 2021-03-09 ENCOUNTER — Encounter (HOSPITAL_BASED_OUTPATIENT_CLINIC_OR_DEPARTMENT_OTHER): Payer: Self-pay | Admitting: Physical Therapy

## 2021-03-09 NOTE — Therapy (Addendum)
[place], [place], [address] Phone: [phone]   Fax:  [phone]  Physical Therapy Treatment/discharge   Patient Details  Name: Sonya Munoz MRN: [id number] Date of Birth: 1966-06-14 Referring Provider (PT): Marda Stalker MD   Encounter Date: 03/08/2021   PT End of Session - 03/09/21 1241     Visit Number 6    Number of Visits 6    Date for PT Re-Evaluation 04/20/21    Authorization Type [facility]    PT Start Time 6948    PT Stop Time 1548   Patient knows her exercises;   PT Time Calculation (min) 33 min    Activity Tolerance Patient tolerated treatment well    Behavior During Therapy [facility] for tasks assessed/performed             Past Medical History:  Diagnosis Date   Anxiety and depression    GERD (gastroesophageal reflux disease)    Insomnia     Past Surgical History:  Procedure Laterality Date   AUGMENTATION MAMMAPLASTY Bilateral 1998   saline    BREAST SURGERY Bilateral    augmentation   fallopian tube repair     RHINOPLASTY     SHOULDER OPEN ROTATOR CUFF REPAIR Left     There were no vitals filed for this visit.   Subjective Assessment - 03/09/21 1238     Subjective Patients symptoms are better but she countinues to have pain when she sits the wrong way. The pain is in the interior portion of her buttock. She has been exercising nad working out.    Pertinent History right shoulder pain;    How long can you sit comfortably? Sits for long periods during the day    How long can you stand comfortably? limited distances    How long can you walk comfortably? walking up hill and distances cause pain    Diagnostic tests nothing    Patient Stated Goals to have less pain    Currently in Pain? No/denies   onyl when she puts pressure on the area               [facility] PT Assessment - 03/09/21 0001       Cognition   Overall Cognitive Status    Attention    Focused  Attention    Memory    Awareness    Problem Solving    Executive Function      Single Leg Squat   Comments l=R                           [facility] Adult PT Treatment/Exercise - 03/09/21 0001       Manual Therapy   Manual Therapy Soft tissue mobilization    Manual therapy comments skilled palpation of soft tissue during DN    Soft tissue mobilization to high hamstring area; IASTM to proximal gluteal              Trigger Point Dry Needling - 03/09/21 0001     Consent Given? Yes    Education Handout Provided Yes    Hamstring Response Palpable increased muscle length;Twitch response elicited    Piriformis Response Twitch response elicited;Palpable increased muscle length    Obturator internus Response Twitch response elicited                  PT Education - 03/09/21 1240     Education Details  HEP and symptom management    Person(s) Educated Patient    Methods Explanation;Tactile cues;Demonstration;Verbal cues    Comprehension Verbalized understanding;Returned demonstration;Verbal cues required;Tactile cues required                 PT Long Term Goals - 03/09/21 1245       PT LONG TERM GOAL #1   Title Patient will return to dead lifting without pain    Baseline still having pulling    Time 6    Period Weeks    Status On-going      PT LONG TERM GOAL #2   Title Patient will return to walking up hills without pain    Time 6    Period Weeks    Status On-going      PT LONG TERM GOAL #3   Title Patient will demonstrate an 80% ability on FOTO    Time 6    Period Weeks    Status On-going      PT LONG TERM GOAL #4   Title demonstrate 5/5 Rt shoulder strength for improved function    Status On-going                   Plan - 03/09/21 1241     Clinical Impression Statement Patient reports it is 75% better but still lingering around. She has been working out and perfroming her HEP but continues to have focal apin in the  interior buttock/ upper hamstring area. SHe will continue to work on her exercises for now but may see sports med to see if there is any other treatment options. Therapy focused on manual therapy today and talked with the patient about activity going forward. She will be put on hold for now.    Personal Factors and Comorbidities Time since onset of injury/illness/exacerbation    Examination-Activity Limitations Lift;Locomotion Level;Stairs;Squat;Stand    Examination-Participation Restrictions Community Activity;Medication Management;School;Occupation    Stability/Clinical Decision Making Evolving/Moderate complexity    Clinical Decision Making Low    Rehab Potential Excellent    PT Frequency 1x / week    PT Duration 6 weeks    PT Treatment/Interventions ADLs/Self Care Home Management;Electrical Stimulation;Cryotherapy;Iontophoresis 9m/ml Dexamethasone;Moist Heat;Traction;Ultrasound;Gait training;DME Instruction;Functional mobility training;Therapeutic activities;Therapeutic exercise;Neuromuscular re-education;Patient/family education;Manual techniques;Scar mobilization;Passive range of motion;Dry needling;Taping    PT Next Visit Plan continue with trigger point dry needling; continue with eccentric loading of the hamstring; reviewe deadlifts form elevated place    PT Home Exercise Plan Access Code: [email or web address]  URL: [email or web address]  Date: 01/13/2021  Prepared by: DCarolyne Littles   Exercises  Seated Hamstring Stretch - 1 x daily - 7 x weekly - 3 sets - 3 reps - 20 hold  The Diver - 1 x daily - 7 x weekly - 3 sets - 10 reps  Single Leg Balance with Opposite Leg Star Reach - 1 x daily - 7 x weekly - 3 sets - 5 reps  Supine Bridge - 1 x daily - 7 x weekly - 3 sets - 10 reps    Consulted and Agree with Plan of Care Patient             Patient will benefit from skilled therapeutic intervention in order to improve the following deficits and impairments:  Abnormal gait,Pain,Increased  muscle spasms,Increased fascial restricitons,Decreased range of motion,Decreased mobility  Visit Diagnosis: Pain in left leg - Plan: PT plan of care cert/re-cert  Cramp and spasm - Plan: PT plan of care cert/re-cert  Cervicalgia -  Plan: PT plan of care cert/re-cert  PHYSICAL THERAPY DISCHARGE SUMMARY  Visits from Start of Care: 5  Current functional level related to goals / functional outcomes: 75% improvement but still painful    Remaining deficits: Still has pain with dedlifts    Education / Equipment: HEP   Patient agrees to discharge. Patient goals were partially met. Patient is being discharged due to being pleased with the current functional level.    Problem List Patient Active Problem List   Diagnosis Date Noted   Hallux rigidus of right foot 11/04/2020   Right shoulder pain 12/27/2017    Carney Living PT DPT  03/09/2021, 12:50 PM  [facility] [address] [place], [place], [address] Phone: [phone]   Fax:  [phone]  Name: Shenaya Lebo MRN: [id number] Date of Birth: Aug 22, 1966

## 2021-03-16 NOTE — Progress Notes (Signed)
Subjective:    CC: L post thigh/hamstring pain  I, Sonya Munoz, LAT, ATC, am serving as scribe for Dr. Lynne Leader.  HPI: Pt is a 55 y/o female presenting w/ L post thigh / hamstring pain x several months. MOI: Pt suspect she overworked hamstring in her workout training. She has already completed a course of PT x 6 visits w/ some improvement noted. Pt notes w/ PT has improved pain to about 75%, but the pain is still lingering. She locates her pain to proximal hamstring near the insertion area of the L hamstring.  Patient is physically fit and lift weights for exercise.  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  Low back pain: no LE numbness/tingling: no Aggravating factors: Deadlifts; walking uphill; Treatments tried: PT, dry needling, massage, rest  Pertinent review of Systems: No fevers or chills  Relevant historical information: Hypertension.  No history of migraines   Objective:    Vitals:   03/17/21 0842  BP: 136/86  Pulse: 73  SpO2: 100%   General: Well Developed, well nourished, and in no acute distress.   MSK: Left hip normal-appearing normal motion.  Minimally tender to palpation at left ischial tuberosity. Normal hip strength. Some pain with resisted knee flexion. Negative H test.   Lab and Radiology Results  Images left hip obtained today personally and independently interpreted. Minimal cortical irregularity present at ischial tuberosity left side.  No avulsion fractures visible.  No significant or severe degenerative changes. Await formal radiology review  Diagnostic Limited MSK Ultrasound of: Left ischial tuberosity Left hamstring origin ischial tuberosity shows slight area of hypoechoic change at ischial tuberosity consistent with either calcific insertional tendinopathy or tiny avulsion fragment.  Small amount of hypoechoic change present as well. Impression: Consistent with a hamstring avulsion  injury healing    Impression and Recommendations:    Assessment and Plan: 55 y.o. female with left proximal hamstring injury.  This is being effectively treated with physical therapy but taking a long time to get better.  She may also have some ischial tuberosity bursitis or chronic calcific insertional tendinopathy as well.  Discussed options.  Think that more conservative management is probably a good idea.  Plan for trial of nitroglycerin patch protocol while continuing home exercise program and limited PT.  Reassess in about a month.  If not better next step should probably be MRI for potential injection planning.  Patient will let me know how she is feeling in 1 month if not better I will order MRI.Marland Kitchen  PDMP not reviewed this encounter. Orders Placed This Encounter  Procedures  . [place] LIMITED JOINT SPACE STRUCTURES LOW LEFT(NO LINKED CHARGES)    Standing Status:   Future    Number of Occurrences:   1    Standing Expiration Date:   09/17/2021    Order Specific Question:   Reason for Exam (SYMPTOM  OR DIAGNOSIS REQUIRED)    Answer:   hamstring pain    Order Specific Question:   Preferred imaging location?    Answer:   [facility]  . DG HIP UNILAT W OR W/O PELVIS 2-3 VIEWS LEFT    Standing Status:   Future    Number of Occurrences:   1    Standing Expiration Date:   03/17/2022    Order Specific Question:   Reason for Exam (SYMPTOM  OR DIAGNOSIS REQUIRED)    Answer:   left thigh pain  Order Specific Question:   Preferred imaging location?    Answer:   [facility]    Order Specific Question:   Is patient pregnant?    Answer:   No   Meds ordered this encounter  Medications  . nitroGLYCERIN (NITRODUR - DOSED IN MG/24 HR) 0.2 mg/hr patch    Sig: Apply 1/4 patch daily to tendon for tendonitis.    Dispense:  30 patch    Refill:  1    Discussed warning signs or symptoms. Please see discharge instructions. Patient expresses understanding.   The  above documentation has been reviewed and is accurate and complete Lynne Leader, M.D.

## 2021-03-17 ENCOUNTER — Ambulatory Visit: Payer: Self-pay

## 2021-03-17 ENCOUNTER — Ambulatory Visit (INDEPENDENT_AMBULATORY_CARE_PROVIDER_SITE_OTHER): Payer: 59 | Admitting: Family Medicine

## 2021-03-17 ENCOUNTER — Ambulatory Visit (INDEPENDENT_AMBULATORY_CARE_PROVIDER_SITE_OTHER): Payer: 59

## 2021-03-17 ENCOUNTER — Other Ambulatory Visit: Payer: Self-pay

## 2021-03-17 ENCOUNTER — Other Ambulatory Visit (HOSPITAL_COMMUNITY): Payer: Self-pay

## 2021-03-17 VITALS — BP 136/86 | HR 73 | Ht 67.0 in | Wt 138.8 lb

## 2021-03-17 DIAGNOSIS — M79652 Pain in left thigh: Secondary | ICD-10-CM

## 2021-03-17 DIAGNOSIS — M76892 Other specified enthesopathies of left lower limb, excluding foot: Secondary | ICD-10-CM

## 2021-03-17 MED ORDER — NITROGLYCERIN 0.2 MG/HR TD PT24
MEDICATED_PATCH | TRANSDERMAL | 1 refills | Status: DC
Start: 2021-03-17 — End: 2021-05-27
  Filled 2021-03-17: qty 8, 30d supply, fill #0

## 2021-03-17 NOTE — Patient Instructions (Addendum)
Thank you for coming in today.  Please get an Xray today before you leave.  Nitroglycerin Protocol   Apply 1/4 nitroglycerin patch to affected area daily.  Change position of patch within the affected area every 24 hours.  You may experience a headache during the first 1-2 weeks of using the patch, these should subside.  If you experience headaches after beginning nitroglycerin patch treatment, you may take your preferred over the counter pain reliever.  Another side effect of the nitroglycerin patch is skin irritation or rash related to patch adhesive.  Please notify our office if you develop more severe headaches or rash, and stop the patch.  Tendon healing with nitroglycerin patch may require 12 to 24 weeks depending on the extent of injury.  Men should not use if taking Viagra, Cialis, or Levitra.   Do not use if you have migraines or rosacea.   Continue the home exercises and limited PT.   Offload the ischial tuberosity.   If not improving in about 1 month let me know and I will order and MRI.   Typically I will ask for recheck after the MRI.   The [place] L Protocol for Hamstring Strains  Lyman Bishop PT  Nordic Curls are probably ok. Ask Shanon Brow.

## 2021-03-18 NOTE — Progress Notes (Signed)
Left hip x-ray looks normal to radiology.

## 2021-03-26 ENCOUNTER — Other Ambulatory Visit (HOSPITAL_COMMUNITY): Payer: Self-pay

## 2021-03-26 MED FILL — Trazodone HCl Tab 50 MG: ORAL | 90 days supply | Qty: 90 | Fill #0 | Status: AC

## 2021-04-05 ENCOUNTER — Other Ambulatory Visit: Payer: Self-pay

## 2021-04-05 ENCOUNTER — Other Ambulatory Visit (HOSPITAL_BASED_OUTPATIENT_CLINIC_OR_DEPARTMENT_OTHER)
Admission: RE | Admit: 2021-04-05 | Discharge: 2021-04-05 | Disposition: A | Discharge status: Home / Self Care | Payer: 59 | Source: Ambulatory Visit | Attending: Family Medicine | Admitting: Family Medicine

## 2021-04-05 DIAGNOSIS — Z0000 Encounter for general adult medical examination without abnormal findings: Secondary | ICD-10-CM | POA: Insufficient documentation

## 2021-04-05 LAB — LIPID PANEL
Cholesterol: 252 mg/dL — ABNORMAL HIGH (ref 0–200)
HDL: 78 mg/dL (ref >40)
LDL Cholesterol: 158 mg/dL — ABNORMAL HIGH (ref 0–99)
Total CHOL/HDL Ratio: 3.2 RATIO
Triglycerides: 82 mg/dL (ref <150)
VLDL: 16 mg/dL (ref 0–40)

## 2021-04-05 LAB — COMPREHENSIVE METABOLIC PANEL
ALT: 19 U/L (ref 0–44)
AST: 24 U/L (ref 15–41)
Albumin: 4.5 g/dL (ref 3.5–5.0)
Alkaline Phosphatase: 47 U/L (ref 38–126)
Anion gap: 7 (ref 5–15)
BUN: 19 mg/dL (ref 6–20)
CO2: 29 mmol/L (ref 22–32)
Calcium: 9.9 mg/dL (ref 8.9–10.3)
Chloride: 94 mmol/L — ABNORMAL LOW (ref 98–111)
Creatinine, Ser: 0.64 mg/dL (ref 0.44–1.00)
GFR, Estimated: >60 mL/min (ref >60)
Glucose, Bld: 107 mg/dL — ABNORMAL HIGH (ref 70–99)
Potassium: 4.6 mmol/L (ref 3.5–5.1)
Sodium: 130 mmol/L — ABNORMAL LOW (ref 135–145)
Total Bilirubin: 0.5 mg/dL (ref 0.3–1.2)
Total Protein: 7.4 g/dL (ref 6.5–8.1)

## 2021-04-11 ENCOUNTER — Other Ambulatory Visit (HOSPITAL_COMMUNITY): Payer: Self-pay

## 2021-04-11 MED FILL — Sertraline HCl Tab 50 MG: ORAL | 90 days supply | Qty: 90 | Fill #0 | Status: AC

## 2021-05-06 ENCOUNTER — Encounter: Payer: Self-pay | Admitting: Family Medicine

## 2021-05-06 DIAGNOSIS — M79652 Pain in left thigh: Secondary | ICD-10-CM

## 2021-05-06 DIAGNOSIS — M76892 Other specified enthesopathies of left lower limb, excluding foot: Secondary | ICD-10-CM

## 2021-05-11 ENCOUNTER — Other Ambulatory Visit: Payer: Self-pay | Admitting: Family Medicine

## 2021-05-11 DIAGNOSIS — Z1231 Encounter for screening mammogram for malignant neoplasm of breast: Secondary | ICD-10-CM

## 2021-05-15 ENCOUNTER — Ambulatory Visit (INDEPENDENT_AMBULATORY_CARE_PROVIDER_SITE_OTHER): Payer: 59

## 2021-05-15 ENCOUNTER — Other Ambulatory Visit: Payer: Self-pay

## 2021-05-15 DIAGNOSIS — M76892 Other specified enthesopathies of left lower limb, excluding foot: Secondary | ICD-10-CM | POA: Diagnosis not present

## 2021-05-15 DIAGNOSIS — M7122 Synovial cyst of popliteal space [Baker], left knee: Secondary | ICD-10-CM | POA: Diagnosis not present

## 2021-05-15 DIAGNOSIS — R600 Localized edema: Secondary | ICD-10-CM | POA: Diagnosis not present

## 2021-05-15 DIAGNOSIS — M79652 Pain in left thigh: Secondary | ICD-10-CM | POA: Diagnosis not present

## 2021-05-17 ENCOUNTER — Encounter: Payer: Self-pay | Admitting: Family Medicine

## 2021-05-23 NOTE — Progress Notes (Signed)
MRI of the left leg shows edema (swelling) around the origin of the hamstring tendon which is consistent with tendinitis or strain but the MRI did not show a tear.  Additionally there is a small Baker's cyst behind the left knee which is probably not causing the pain that you are having.  This is something that should have gotten better with physical therapy.  Reasonable at this point to try an injection potentially.  Recommend return to clinic to go over the MRI report and discuss next treatment steps and options and potentially perform an injection.

## 2021-05-24 DIAGNOSIS — Z1231 Encounter for screening mammogram for malignant neoplasm of breast: Secondary | ICD-10-CM

## 2021-05-26 NOTE — Progress Notes (Signed)
I, Sonya Munoz, LAT, ATC, am serving as scribe for Dr. Lynne Leader.  Sonya Munoz is a 55 y.o. female who presents to [place] at [facility] today for f/u of L hamstring/post thigh pain.  She was last seen by Dr. Georgina Snell on 03/17/21 and noted 75% improvement in her symptoms after having completed 6 PT sessions.  She was prescribed nitroglycerin patches.  She was later referred for an MRI due to plateauing of her symptoms.  Today, pt reports that at his point, she is no better than previously.  Her most aggravating activities are squats, prolonged sitting, walking uphill and resisted h/s exercises.  Diagnostic imaging: L femur MRI- 05/15/21; L hip XR- 03/17/21  Pertinent review of systems: no fever or chills  Relevant historical information: history of foot pain   Exam:  BP 122/82 (BP Location: Left Arm, Patient Position: Sitting, Cuff Size: Normal)   Pulse 84   Ht '5\' 7"'$  (1.702 m)   Wt 139 lb (63 kg)   SpO2 99%   BMI 21.77 kg/m  General: Well Developed, well nourished, and in no acute distress.   MSK: Left buttocks tender palpation left hamstring insertion ischial tuberosity.     Lab and Radiology Results  Procedure: Real-time Ultrasound Guided Injection of left ischial tuberosity hamstring origin Device: Philips Affiniti 50G Images permanently stored and available for review in PACS Verbal informed consent obtained.  Discussed risks and benefits of procedure. Warned about infection bleeding damage to structures skin hypopigmentation and fat atrophy among others. Patient expresses understanding and agreement Time-out conducted.   Noted no overlying erythema, induration, or other signs of local infection.   Skin prepped in a sterile fashion.   Local anesthesia: Topical Ethyl chloride.   With sterile technique and under real time ultrasound guidance: 80 mg of triamcinolone injected into left hamstring origin ischial tuberosity. Fluid seen entering the  hamstring origin.   Completed without difficulty    Advised to call if fevers/chills, erythema, induration, drainage, or persistent bleeding.   Images permanently stored and available for review in the ultrasound unit.  Impression: Technically successful ultrasound guided injection.    EXAM: MR OF THE LEFT FEMUR WITHOUT CONTRAST   TECHNIQUE: Multiplanar, multisequence MR imaging of the left femur was performed. No intravenous contrast was administered.   COMPARISON:  Plain films left hip 03/17/2021.   FINDINGS: Bones/Joint/Cartilage   Marrow signal is normal throughout without fracture, stress change or focal lesion. No subchondral cyst formation or edema about the hip or knee. No avascular necrosis of the femoral heads. No hip or knee effusion.   Ligaments   Normal.   Muscles and Tendons   No tear is identified. There is mild edema about the hamstrings bilaterally which is somewhat worse on the left compatible with tendinosis. Please note that the right hamstring origin is visualized in the coronal plane only. No muscle atrophy or focal lesion.   Soft tissues   Negative for mass. The patient has a Baker's cyst of the left knee measuring approximately 2.2 cm AP x 1.2 cm transverse by 3 cm craniocaudal.   IMPRESSION: Negative for muscle or tendon tear. Mild edema at the hamstring origins bilaterally is greater on the left and compatible with tendinosis or strain.   Small Baker's cyst left knee.     Electronically Signed   By: Inge Rise M.D.   On: 05/16/2021 10:38  I, Lynne Leader, personally (independently) visualized and performed the interpretation of the images attached  in this note.     Assessment and Plan: 55 y.o. female with left hamstring tendinitis.  Improved with conservative management but not fully resolved.  Plan for steroid injection today.  We will avoid numbing medicine to avoid blocking the sciatic nerve.  Hopefully the steroids will  help.  If not beneficial next step might be PRP.  Check back as needed.  Continue home exercise program.   PDMP not reviewed this encounter. Orders Placed This Encounter  Procedures   [place] LIMITED JOINT SPACE STRUCTURES LOW LEFT(NO LINKED CHARGES)    Order Specific Question:   Reason for Exam (SYMPTOM  OR DIAGNOSIS REQUIRED)    Answer:   L hamstring pain    Order Specific Question:   Preferred imaging location?    Answer:   [place]   No orders of the defined types were placed in this encounter.    Discussed warning signs or symptoms. Please see discharge instructions. Patient expresses understanding.   The above documentation has been reviewed and is accurate and complete Lynne Leader, M.D.

## 2021-05-27 ENCOUNTER — Ambulatory Visit (INDEPENDENT_AMBULATORY_CARE_PROVIDER_SITE_OTHER): Payer: 59 | Admitting: Family Medicine

## 2021-05-27 ENCOUNTER — Ambulatory Visit: Payer: Self-pay

## 2021-05-27 ENCOUNTER — Other Ambulatory Visit: Payer: Self-pay

## 2021-05-27 ENCOUNTER — Encounter: Payer: Self-pay | Admitting: Family Medicine

## 2021-05-27 VITALS — BP 122/82 | HR 84 | Ht 67.0 in | Wt 139.0 lb

## 2021-05-27 DIAGNOSIS — M76892 Other specified enthesopathies of left lower limb, excluding foot: Secondary | ICD-10-CM

## 2021-05-27 DIAGNOSIS — M79652 Pain in left thigh: Secondary | ICD-10-CM | POA: Diagnosis not present

## 2021-05-27 NOTE — Patient Instructions (Signed)
Thank you for coming in today.   Continue the exercises.   If the steroid injection does not help let me know.  Give it a month.   Next step would be PRP. Give me some notice about that.   Let me know.

## 2021-05-31 ENCOUNTER — Other Ambulatory Visit (HOSPITAL_COMMUNITY): Payer: Self-pay

## 2021-06-30 NOTE — Progress Notes (Signed)
I, [name], LAT, ATC, am serving as scribe for Dr. Lynne Leader.  Sonya Munoz is a 55 y.o. female who presents to [place] at [facility] today for f/u of L hamstring pain and for new R knee pain.  She was last seen by Dr. Georgina Snell on 05/27/21 for MRI review and noted no change in her symptoms.  She had a L IT injection and was advised to con't her HEP.  Today, pt reports R knee starting bothering her about 3 weeks ago. Pt locates pain to superior to R patella on the anterior aspect of knee. Pt reports L hamstring add improved for a few weeks after the steroid injection, but then pain returned over the past week-1.5 weeks. When taking off her sneakers on Tuesday she experienced a "pop" in her L hamstring.  R knee pain: -Swelling: no -Mechanical symptoms: no -Aggravating factors: squatting,  -Treatments tried: knee strap   Diagnostic imaging: L femur MRI- 05/15/21; L hip XR- 03/17/21  Pertinent review of systems: No fevers or chills  Relevant historical information: History prior orthopedic injuries   Exam:  BP (!) 142/88   Pulse 82   Ht '5\' 7"'$  (1.702 m)   Wt 140 lb (63.5 kg)   SpO2 100%   BMI 21.93 kg/m  General: Well Developed, well nourished, and in no acute distress.   MSK: Right knee normal-appearing Normal motion.  Tender palpation anterior knee.  Pain with resisted knee extension although strength is intact. Stable ligamentous exam. Negative Murray's test.  Left posterior hip normal-appearing tender palpation origin hamstring and ischial tuberosity.  Pain with resisted knee flexion and hip extension.    Lab and Radiology Results  Diagnostic Limited MSK Ultrasound of: Right knee and left hamstring Right knee: Quad tendon intact normal-appearing Patellar tendon intact.  Mild hypoechoic fluid tracks superficial to tendon and deep to tendon consistent with prepatellar and infrapatellar bursitis. Medial and lateral joint line  normal-appearing Posterior knee no Baker's cyst. Left hamstring origin small hypoechoic fluid at hamstring origin consistent with tiny partial tear at ischial tuberosity.  Muscle belly hamstring normal appearing. Impression:  Right knee: Prepatellar bursitis and infrapatellar bursitis. Insertional hamstring tendinopathy with partial tear.   EXAM: MR OF THE LEFT FEMUR WITHOUT CONTRAST   TECHNIQUE: Multiplanar, multisequence MR imaging of the left femur was performed. No intravenous contrast was administered.   COMPARISON:  Plain films left hip 03/17/2021.   FINDINGS: Bones/Joint/Cartilage   Marrow signal is normal throughout without fracture, stress change or focal lesion. No subchondral cyst formation or edema about the hip or knee. No avascular necrosis of the femoral heads. No hip or knee effusion.   Ligaments   Normal.   Muscles and Tendons   No tear is identified. There is mild edema about the hamstrings bilaterally which is somewhat worse on the left compatible with tendinosis. Please note that the right hamstring origin is visualized in the coronal plane only. No muscle atrophy or focal lesion.   Soft tissues   Negative for mass. The patient has a Baker's cyst of the left knee measuring approximately 2.2 cm AP x 1.2 cm transverse by 3 cm craniocaudal.   IMPRESSION: Negative for muscle or tendon tear. Mild edema at the hamstring origins bilaterally is greater on the left and compatible with tendinosis or strain.   Small Baker's cyst left knee.     Electronically Signed   By: Inge Rise M.D.   On: 05/16/2021 10:38 I, Lynne Leader,  personally (independently) visualized and performed the interpretation of the images attached in this note.    Assessment and Plan: 55 y.o. female with  Right knee pain due to prepatellar bursitis and infrapatellar bursitis.  Plan for home exercise program and Voltaren gel.  Recheck back if not improving.  Left hamstring  pain recurrent issue not improving with typical conservative measures.  Plan for PRP.  We will schedule for this about next week.    PDMP not reviewed this encounter. Orders Placed This Encounter  Procedures   [place] LIMITED JOINT SPACE STRUCTURES LOW RIGHT(NO LINKED CHARGES)    Standing Status:   Future    Number of Occurrences:   1    Standing Expiration Date:   12/29/2021    Order Specific Question:   Reason for Exam (SYMPTOM  OR DIAGNOSIS REQUIRED)    Answer:   right knee pain    Order Specific Question:   Preferred imaging location?    Answer:   [place]   No orders of the defined types were placed in this encounter.    Discussed warning signs or symptoms. Please see discharge instructions. Patient expresses understanding.   The above documentation has been reviewed and is accurate and complete Lynne Leader, M.D.

## 2021-07-01 ENCOUNTER — Ambulatory Visit: Payer: 59 | Admitting: Family Medicine

## 2021-07-01 ENCOUNTER — Ambulatory Visit: Payer: Self-pay

## 2021-07-01 ENCOUNTER — Telehealth: Payer: Self-pay | Admitting: Family Medicine

## 2021-07-01 ENCOUNTER — Other Ambulatory Visit: Payer: Self-pay

## 2021-07-01 ENCOUNTER — Ambulatory Visit (INDEPENDENT_AMBULATORY_CARE_PROVIDER_SITE_OTHER): Payer: 59 | Admitting: Family Medicine

## 2021-07-01 VITALS — BP 142/88 | HR 82 | Ht 67.0 in | Wt 140.0 lb

## 2021-07-01 DIAGNOSIS — M25561 Pain in right knee: Secondary | ICD-10-CM

## 2021-07-01 DIAGNOSIS — M76892 Other specified enthesopathies of left lower limb, excluding foot: Secondary | ICD-10-CM

## 2021-07-01 NOTE — Patient Instructions (Addendum)
Thank you for coming in today.   Schedule PRP for the hip/hamstring.   I think you have pre-patellar bursitis.   Please use Voltaren gel (Generic Diclofenac Gel) up to 4x daily for pain as needed.  This is available over-the-counter as both the name brand Voltaren gel and the generic diclofenac gel.   Please complete the exercises that the athletic trainer went over with you:  View at [email or web address] using code: [id number]  If not improving we have more to do.   Prepatellar Bursitis Prepatellar bursitis is inflammation of the prepatellar bursa, which is a fluid-filled sac that cushions the kneecap (patella). Prepatellar bursitis happens when fluid builds up in this sac and causes it to swell. The condition causes knee pain. What are the causes? This condition may be caused by: Constant pressure on the knees from kneeling. A hit to the knee. Falling on the knee. Infection from bacteria. Moving the knee often in a forceful way. What increases the risk? You are more likely to develop this condition if: You play sports that have a high risk of falling on the knee or being hit on the knee. These include football, wrestling, basketball, or soccer. You do work in which you kneel for long periods of time, such as roofing, plumbing, or gardening. You have another inflammatory condition, such as gout or rheumatoid arthritis. What are the signs or symptoms? The most common symptom of this condition is knee pain that gets better with rest. Other symptoms include: Swelling on the front of the kneecap. Warmth in the knee. Tenderness with activity. Redness in the knee. Inability to bend the knee or to kneel. How is this diagnosed? This condition is diagnosed based on: A physical exam. Your health care provider will compare your knees and check for tenderness and pain while moving your knee. Your medical history. Tests to check for infection. These may include blood tests and tests on the  fluid in the bursa. Imaging tests, such as X-ray, MRI, or ultrasound, to check for damage in the patella, or fluid buildup and swelling in the bursa. How is this treated? This condition may be treated by: Resting the knee. Putting ice on the knee. Taking medicines, such as: NSAIDs. These can help to reduce pain and swelling. Antibiotics. These may be needed if you have an infection. Steroids. These are used to reduce swelling and inflammation, and may be prescribed if other treatments are not helping. Raising (elevating) the knee while resting. Doing exercises to help you maintain movement (physical therapy). These may be recommended after pain and swelling improve. Having a procedure to remove fluid from the bursa. This may be done if other treatments are not helping. Having surgery to remove the bursa. This may be done if you have a severe infection or if the condition keeps coming back after treatment. Follow these instructions at home: Medicines Take over-the-counter and prescription medicines only as told by your health care provider. If you were prescribed an antibiotic medicine, take it as told by your health care provider. Do not stop taking the antibiotic even if you start to feel better. Managing pain, stiffness, and swelling  If directed, put ice on the injured area. Put ice in a plastic bag. Place a towel between your skin and the bag. Leave the ice on for 20 minutes, 2-3 times a day. Elevate the injured area above the level of your heart while you are sitting or lying down. Activity Do not use the injured limb to support  your body weight until your health care provider says that you can. Rest your knee. Avoid activities that cause knee pain. Return to your normal activities as told by your health care provider. Ask your health care provider what activities are safe for you. Do exercises as told by your health care provider. General instructions Ask your health care  provider when it is safe for you to drive. Do not use any products that contain nicotine or tobacco, such as cigarettes, e-cigarettes, and chewing tobacco. These can delay healing. If you need help quitting, ask your health care provider. Keep all follow-up visits as told by your health care provider. This is important. How is this prevented? Warm up and stretch before being active. Cool down and stretch after being active. Give your body time to rest between periods of activity. Maintain physical fitness, including strength and flexibility. Be safe and responsible while being active. This will help you to avoid falls. Wear knee pads if you have to kneel for a long period of time. Contact a health care provider if: Your symptoms do not improve or get worse. Your symptoms keep coming back after treatment. You develop a fever and have warmth, redness, or swelling over your knee. Summary Prepatellar bursitis is inflammation of the prepatellar bursa, which is a fluid-filled sac that cushions the kneecap (patella). This condition may be caused by injury or constant pressure on the knee. It may also be caused by an infection from bacteria. Symptoms of this condition include pain, swelling, warmth, and tenderness in the knee. Follow instructions from your health care provider about taking medicines, resting, and doing activities. Contact your health care provider if your symptoms do not improve, get worse, or keep coming back after treatment. This information is not intended to replace advice given to you by your health care provider. Make sure you discuss any questions you have with your health care provider. Document Revised: 01/31/2019 Document Reviewed: 12/19/2018 Elsevier Patient Education  [place].

## 2021-07-01 NOTE — Telephone Encounter (Signed)
Pt has scheduled PRP for 9/23. Per her conversation today with Dr. Georgina Snell, she is planning to [place] to/from her visit that day to be cautious.  Pt stated that Dr. Georgina Snell advised he usually calls in some narcotics for PRP aftercare. Since she will not be driving on the date of service, pt requesting we call in the narcotics prior to so she can pick them up beforehand.  [place]

## 2021-07-04 ENCOUNTER — Other Ambulatory Visit (HOSPITAL_COMMUNITY): Payer: Self-pay

## 2021-07-04 MED ORDER — HYDROCODONE-ACETAMINOPHEN 5-325 MG PO TABS
1.0000 | ORAL_TABLET | Freq: Four times a day (QID) | ORAL | 0 refills | Status: DC | PRN
  Filled 2021-07-04: qty 10, 3d supply, fill #0

## 2021-07-04 NOTE — Telephone Encounter (Signed)
Hydrocodone prescribed 

## 2021-07-06 NOTE — Telephone Encounter (Signed)
Pt informed

## 2021-07-13 ENCOUNTER — Ambulatory Visit
Admission: RE | Admit: 2021-07-13 | Discharge: 2021-07-13 | Disposition: A | Discharge status: Home / Self Care | Payer: 59 | Source: Ambulatory Visit | Attending: Family Medicine | Admitting: Family Medicine

## 2021-07-13 ENCOUNTER — Other Ambulatory Visit: Payer: Self-pay

## 2021-07-13 DIAGNOSIS — Z1231 Encounter for screening mammogram for malignant neoplasm of breast: Secondary | ICD-10-CM | POA: Diagnosis not present

## 2021-07-15 ENCOUNTER — Ambulatory Visit: Payer: Self-pay

## 2021-07-15 ENCOUNTER — Ambulatory Visit (INDEPENDENT_AMBULATORY_CARE_PROVIDER_SITE_OTHER): Payer: Self-pay | Admitting: Family Medicine

## 2021-07-15 ENCOUNTER — Other Ambulatory Visit: Payer: Self-pay

## 2021-07-15 DIAGNOSIS — M76892 Other specified enthesopathies of left lower limb, excluding foot: Secondary | ICD-10-CM

## 2021-07-15 NOTE — Patient Instructions (Addendum)
Good to see you today.  You had a L hamstring PRP injection.  Call or go to the ER if you develop a large red swollen joint with extreme pain or oozing puss.    Please perform the exercise program that we have prepared for you and gone over in detail on a daily basis.  In addition to the handout you were provided you can access your program through: [email or web address]   Your unique program code is: [email or web address]   Follow-up as needed.  If you're having issues during the recovery/rehab process, let me know and I'll see you back ASAP.

## 2021-07-15 NOTE — Progress Notes (Signed)
Sonya Munoz presents to clinic today for PRP injection left hamstring origin ischial tuberosity  Procedure: Real-time Ultrasound Guided Injection of hamstring origin left ischial tuberosity Device: Philips Affiniti 50G Images permanently stored and available for review in PACS Verbal informed consent obtained.  Discussed risks and benefits of procedure. Warned about infection bleeding damage to structures skin hypopigmentation and fat atrophy among others. Patient expresses understanding and agreement Time-out conducted.   Noted no overlying erythema, induration, or other signs of local infection.   Skin prepped in a sterile fashion.   Local anesthesia: Topical Ethyl chloride.   With sterile technique and under real time ultrasound guidance: 5 mL of lidocaine injected subcutaneously and into hamstring origin at ischial tuberosity. Fluid seen entering the hamstring origin. The skin was then again sterilized with isopropyl alcohol. Spinal needle was used to access the ischial tuberosity hamstring origin and 5 mL of leukocyte rich PRP was injected in a fan pattern along the origin of the hamstring tendon.   Completed without difficulty she did not experience any radiating numbness or weakness distally. Pain immediately resolved suggesting accurate placement of the medication.   Advised to call if fevers/chills, erythema, induration, drainage, or persistent bleeding.   Images permanently stored and available for review in the ultrasound unit.  Impression: Technically successful ultrasound guided injection.

## 2021-07-20 ENCOUNTER — Other Ambulatory Visit (HOSPITAL_COMMUNITY): Payer: Self-pay

## 2021-07-20 MED ORDER — TRAZODONE HCL 50 MG PO TABS
ORAL_TABLET | ORAL | 1 refills | Status: DC
  Filled 2021-07-20: qty 90, 90d supply, fill #0
  Filled 2021-10-18: qty 90, 90d supply, fill #1

## 2021-07-20 MED ORDER — SERTRALINE HCL 50 MG PO TABS
ORAL_TABLET | ORAL | 1 refills | Status: DC
  Filled 2021-07-20: qty 90, 90d supply, fill #0
  Filled 2021-10-18: qty 90, 90d supply, fill #1

## 2021-07-22 ENCOUNTER — Other Ambulatory Visit (HOSPITAL_COMMUNITY): Payer: Self-pay

## 2021-07-25 ENCOUNTER — Other Ambulatory Visit (HOSPITAL_BASED_OUTPATIENT_CLINIC_OR_DEPARTMENT_OTHER): Payer: Self-pay

## 2021-07-25 ENCOUNTER — Ambulatory Visit: Payer: 59 | Attending: Internal Medicine

## 2021-07-25 DIAGNOSIS — Z23 Encounter for immunization: Secondary | ICD-10-CM

## 2021-07-25 MED ORDER — INFLUENZA VAC SPLIT QUAD 0.5 ML IM SUSY
PREFILLED_SYRINGE | INTRAMUSCULAR | 0 refills | Status: DC
  Filled 2021-07-25: qty 0.5, 1d supply, fill #0

## 2021-07-25 NOTE — Progress Notes (Signed)
   Covid-19 Vaccination Clinic  Name:  [name]    MRN: [id number] DOB: 09/09/1966  07/25/2021  [name] was observed post Covid-19 immunization for 15 minutes without incident. She was provided with Vaccine Information Sheet and instruction to access the V-Safe system.   [name] was instructed to call 911 with any severe reactions post vaccine: Difficulty breathing  Swelling of face and throat  A fast heartbeat  A bad rash all over body  Dizziness and weakness

## 2021-08-02 ENCOUNTER — Other Ambulatory Visit (HOSPITAL_BASED_OUTPATIENT_CLINIC_OR_DEPARTMENT_OTHER): Payer: Self-pay

## 2021-08-02 MED ORDER — COVID-19MRNA BIVAL VACC PFIZER 30 MCG/0.3ML IM SUSP
INTRAMUSCULAR | 0 refills | Status: DC
  Filled 2021-08-02: qty 0.3, 1d supply, fill #0

## 2021-08-08 ENCOUNTER — Encounter: Payer: Self-pay | Admitting: Family Medicine

## 2021-08-09 ENCOUNTER — Other Ambulatory Visit: Payer: Self-pay | Admitting: Physical Therapy

## 2021-08-09 DIAGNOSIS — M76892 Other specified enthesopathies of left lower limb, excluding foot: Secondary | ICD-10-CM

## 2021-08-16 ENCOUNTER — Other Ambulatory Visit: Payer: Self-pay

## 2021-08-16 ENCOUNTER — Encounter (HOSPITAL_BASED_OUTPATIENT_CLINIC_OR_DEPARTMENT_OTHER): Payer: Self-pay | Admitting: Physical Therapy

## 2021-08-16 ENCOUNTER — Encounter (HOSPITAL_BASED_OUTPATIENT_CLINIC_OR_DEPARTMENT_OTHER): Payer: 59 | Attending: Family Medicine | Admitting: Physical Therapy

## 2021-08-16 DIAGNOSIS — R252 Cramp and spasm: Secondary | ICD-10-CM | POA: Insufficient documentation

## 2021-08-16 DIAGNOSIS — M79605 Pain in left leg: Secondary | ICD-10-CM | POA: Diagnosis not present

## 2021-08-16 DIAGNOSIS — M542 Cervicalgia: Secondary | ICD-10-CM | POA: Insufficient documentation

## 2021-08-16 DIAGNOSIS — R293 Abnormal posture: Secondary | ICD-10-CM | POA: Diagnosis not present

## 2021-08-16 DIAGNOSIS — R29898 Other symptoms and signs involving the musculoskeletal system: Secondary | ICD-10-CM | POA: Insufficient documentation

## 2021-08-16 DIAGNOSIS — M76892 Other specified enthesopathies of left lower limb, excluding foot: Secondary | ICD-10-CM | POA: Diagnosis not present

## 2021-08-16 NOTE — Therapy (Signed)
OUTPATIENT PHYSICAL THERAPY LOWER EXTREMITY EVALUATION   Patient Name: Sonya Munoz MRN: [id number] DOB:Jan 02, 1966, 55 y.o., female Today's Date: 08/17/2021   PT End of Session - 08/17/21 1420     Visit Number 1    Number of Visits 12    Date for PT Re-Evaluation 09/28/21    PT Start Time 1100    PT Stop Time 1145    PT Time Calculation (min) 45 min    Activity Tolerance Patient tolerated treatment well    Behavior During Therapy WFL for tasks assessed/performed             Past Medical History:  Diagnosis Date   Anxiety and depression    GERD (gastroesophageal reflux disease)    Insomnia    Past Surgical History:  Procedure Laterality Date   AUGMENTATION MAMMAPLASTY Bilateral 1998   saline    BREAST SURGERY Bilateral    augmentation   fallopian tube repair     RHINOPLASTY     SHOULDER OPEN ROTATOR CUFF REPAIR Left    Patient Active Problem List   Diagnosis Date Noted   Hallux rigidus of right foot 11/04/2020   Right shoulder pain 12/27/2017    PCP: Marda Stalker, PA-C  REFERRING PROVIDER: Marda Stalker, PA-C  REFERRING DIAG: [phone] (ICD-10-CM) - Hamstring tendinitis of left thigh  THERAPY DIAG:  M01.027 (ICD-10-CM) - Hamstring tendinitis of left thigh  ONSET DATE: 1 year prior. Patient had a PRP injection 5 weeks prior.  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 She went to PT with some improvement but the pain came back. She had a PRP injection about 5 weeks ago She continues to ave pain when she sits for too long. She has a new onset of left knee pain. She has been diagnosed with bursitis of the right knee. The knee gets flaired up at times , but it depends on the day.   PERTINENT HISTORY: Right knee bursitis, anxiety   PAIN:  Are you having pain? No VAS scale: 7/10 at worst Pain location: Left hamstring insertion  Pain orientation: Left  PAIN TYPE: aching Pain  description: intermittent  Aggravating factors: sitting  Relieving factors: changing position   PRECAUTIONS: None  WEIGHT BEARING RESTRICTIONS No  FALLS:  Has patient fallen in last 6 months? 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   PLOF: Independent Patient very active prior to onset of pain  PATIENT GOALS   To return to active lifestyle    OBJECTIVE:   DIAGNOSTIC FINDINGS: Nothing since PRP   PATIENT SURVEYS:  FOTO    COGNITION:  Overall cognitive status: Within functional limits for tasks assessed     SENSATION:  Light touch: Appears intact  Stereognosis: Appears intact  Hot/Cold: Appears intact  Proprioception: Appears intact  POSTURE:  Good   LE AROM/PROM: Full active and passive ROM   Hamstring 110 right 75 left   LE MMT:  MMT Right 08/17/2021 Left 08/17/2021  Hip flexion 33.9 33.5  Hip extension 20.6 17.1  Hip abduction 30.6 36.3  Hip adduction    Hip internal rotation    Hip external rotation    Knee flexion 17 13.7  Knee extension 45.1 40.9  Ankle dorsiflexion    Ankle plantarflexion    Ankle inversion    Ankle eversion     (Blank rows = not tested)  FUNCTIONAL TESTS:  Squat: feels at times but good form   GAIT: No gait abnormality    TODAY'S TREATMENT: Reviewed home exercises. Advised patient to stop stretching for now; concentrate on bridge; hip extension RDL's  for home; continue with the tennis ball in that area if it helps    PATIENT EDUCATION:  Education details: reviewed progression of exercises and activity  Person educated: Patient Education method: Explanation, Demonstration, Tactile cues, Verbal cues, and Handouts Education comprehension: verbalized understanding, returned demonstration, verbal cues required, tactile cues required, and needs further education   HOME EXERCISE PROGRAM: Reviewed home exercises. 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 She is 5 weeks S/P PRP injection. She has limited hip extension and knee flexion strength on the left. She has limited hamstring motion on the left. She has the most pain when she sits. She has had success with needling and therapy in the past. She would benefit from skilled therapy to improve ability to sit and to return to the gym.   decreased activity tolerance, decreased ROM, decreased strength, impaired flexibility, and pain  Time since onset of injury/illness/exacerbation  Sitting at work; going to the gym    REHAB POTENTIAL: Good  CLINICAL DECISION MAKING: Stable/uncomplicated  EVALUATION COMPLEXITY: Low   GOALS: Goals reviewed with patient? No  SHORT TERM GOALS:  STG Name Target Date Goal status  1 Patient will increase left hamstring length to 100 degrees  Baseline:  09/07/2021 INITIAL  2 Patient will increase left knee flexion strength by 5 lbs Baseline:  09/07/2021 INITIAL  3 Patient will sit for 4 hours without increased pain  Baseline: 09/07/2021 INITIAL  LONG TERM GOALS:   LTG Name Target Date Goal status  1 Patient will sit through work shift without pain  Baseline: 09/28/2021 INITIAL  2 Patient will return to all gym activity without pain  Baseline: 09/28/2021 INITIAL    PLAN: PT FREQUENCY: 2x/week  PT DURATION: 6 weeks  PLANNED INTERVENTIONS: Therapeutic exercises, Therapeutic activity, Neuro Muscular re-education, Balance training, Gait training, Patient/Family education, and Joint mobilization dry needling, aquatic therapy; ionto  PLAN FOR NEXT SESSION: IASTYM to hamstrings; needling to hamstrings; review exercises; consider gym and kettle bell exercises    Carney Living PT DPT   08/17/2021, 4:45 PM

## 2021-08-17 ENCOUNTER — Encounter (HOSPITAL_BASED_OUTPATIENT_CLINIC_OR_DEPARTMENT_OTHER): Payer: Self-pay | Admitting: Physical Therapy

## 2021-08-23 ENCOUNTER — Other Ambulatory Visit: Payer: Self-pay

## 2021-08-23 ENCOUNTER — Ambulatory Visit (HOSPITAL_BASED_OUTPATIENT_CLINIC_OR_DEPARTMENT_OTHER): Payer: 59 | Attending: Family Medicine | Admitting: Physical Therapy

## 2021-08-23 ENCOUNTER — Encounter (HOSPITAL_BASED_OUTPATIENT_CLINIC_OR_DEPARTMENT_OTHER): Payer: Self-pay | Admitting: Physical Therapy

## 2021-08-23 DIAGNOSIS — R293 Abnormal posture: Secondary | ICD-10-CM | POA: Insufficient documentation

## 2021-08-23 DIAGNOSIS — M79605 Pain in left leg: Secondary | ICD-10-CM | POA: Insufficient documentation

## 2021-08-23 DIAGNOSIS — M9903 Segmental and somatic dysfunction of lumbar region: Secondary | ICD-10-CM | POA: Diagnosis not present

## 2021-08-23 DIAGNOSIS — R252 Cramp and spasm: Secondary | ICD-10-CM | POA: Insufficient documentation

## 2021-08-23 DIAGNOSIS — M542 Cervicalgia: Secondary | ICD-10-CM | POA: Insufficient documentation

## 2021-08-23 DIAGNOSIS — M9901 Segmental and somatic dysfunction of cervical region: Secondary | ICD-10-CM | POA: Diagnosis not present

## 2021-08-23 DIAGNOSIS — R29898 Other symptoms and signs involving the musculoskeletal system: Secondary | ICD-10-CM | POA: Insufficient documentation

## 2021-08-23 DIAGNOSIS — M546 Pain in thoracic spine: Secondary | ICD-10-CM | POA: Diagnosis not present

## 2021-08-23 NOTE — Therapy (Signed)
OUTPATIENT PHYSICAL THERAPY TREATMENT NOTE   Patient Name: Sonya Munoz MRN: [id number] DOB:[birth year], 55 y.o., female Today's Date: 08/23/2021  PCP: Marda Stalker, PA-C REFERRING PROVIDER: Gregor Hams, MD   PT End of Session - 08/23/21 [phone]     Visit Number 2    Number of Visits 12    Date for PT Re-Evaluation 09/28/21    PT Start Time 0930    PT Stop Time 1012    PT Time Calculation (min) 42 min    Activity Tolerance Patient tolerated treatment well    Behavior During Therapy [facility] for tasks assessed/performed             Past Medical History:  Diagnosis Date   Anxiety and depression    GERD (gastroesophageal reflux disease)    Insomnia    Past Surgical History:  Procedure Laterality Date   AUGMENTATION MAMMAPLASTY Bilateral 1998   saline    BREAST SURGERY Bilateral    augmentation   fallopian tube repair     RHINOPLASTY     SHOULDER OPEN ROTATOR CUFF REPAIR Left    Patient Active Problem List   Diagnosis Date Noted   Hallux rigidus of right foot 11/04/2020   Right shoulder pain 12/27/2017   PCP: Marda Stalker, PA-C   REFERRING PROVIDER: Lynne Leader MD   REFERRING DIAG: [phone] (ICD-10-CM) - Hamstring tendinitis of left thigh   THERAPY DIAG:  W26.378 (ICD-10-CM) - Hamstring tendinitis of left thigh   ONSET DATE: 1 year prior. Patient had a PRP injection 5 weeks prior.  SUBJECTIVE:    SUBJECTIVE STATEMENT: Patient reports she is having a little less pain working out. She continues to have pain when she is sitting. Her chair pad may be helping.    PERTINENT HISTORY: Right knee bursitis, anxiety    PAIN:  None right now, but she is not sitting  Todays treatment:    Manual therapy: IASTYM and STM to mid belly of the hamstring   There-ex:  Reviewed the program she will be doing with her trainer. The only exercise that may be problematic is [place] split squats.   Hamstring:  Prone light resisted knee flexion throughout motion:  manual resistance x20  Bridge with band green x20  Hamstring iso on ball x20 Double knee to chest on ball x20  Single leg reach 3x10 at counter          CLINICAL IMPRESSION: Patient tolerated treatment well. Her hamstring motion was measured at 85 degrees compared to 65 at baseline. The goal is to increase her hamstring motion without pulling on the healing area. She performed exercises within her pain free ranges. She was given single leg reach for her HEP. She is having the most tightness with LE extension. She reports with repetition it improves though.   decreased activity tolerance, decreased ROM, decreased strength, impaired flexibility, and pain   Time since onset of injury/illness/exacerbation   Sitting at work; going to the gym      REHAB POTENTIAL: Good   CLINICAL DECISION MAKING: Stable/uncomplicated   EVALUATION COMPLEXITY: Low     GOALS: Goals reviewed with patient? No   SHORT TERM GOALS:   STG Name Target Date Goal status  1 Patient will increase left hamstring length to 100 degrees  Baseline:  09/07/2021 INITIAL  2 Patient will increase left knee flexion strength by 5 lbs Baseline:  09/07/2021 INITIAL  3 Patient will sit for 4 hours without increased pain  Baseline: 09/07/2021 INITIAL  LONG  TERM GOALS:    LTG Name Target Date Goal status  1 Patient will sit through work shift without pain  Baseline: 09/28/2021 INITIAL  2 Patient will return to all gym activity without pain  Baseline: 09/28/2021 INITIAL      PLAN: PT FREQUENCY: 2x/week   PT DURATION: 6 weeks   PLANNED INTERVENTIONS: Therapeutic exercises, Therapeutic activity, Neuro Muscular re-education, Balance training, Gait training, Patient/Family education, and Joint mobilization dry needling, aquatic therapy; ionto   PLAN FOR NEXT SESSION: IASTYM to hamstrings; needling to hamstrings; review exercises; consider gym and kettle bell exercises           Carney Living PT DPT   08/23/2021, 10:50 AM

## 2021-08-24 DIAGNOSIS — M546 Pain in thoracic spine: Secondary | ICD-10-CM | POA: Diagnosis not present

## 2021-08-24 DIAGNOSIS — M9903 Segmental and somatic dysfunction of lumbar region: Secondary | ICD-10-CM | POA: Diagnosis not present

## 2021-08-24 DIAGNOSIS — M542 Cervicalgia: Secondary | ICD-10-CM | POA: Diagnosis not present

## 2021-08-24 DIAGNOSIS — M9901 Segmental and somatic dysfunction of cervical region: Secondary | ICD-10-CM | POA: Diagnosis not present

## 2021-08-25 DIAGNOSIS — M9901 Segmental and somatic dysfunction of cervical region: Secondary | ICD-10-CM | POA: Diagnosis not present

## 2021-08-25 DIAGNOSIS — M9903 Segmental and somatic dysfunction of lumbar region: Secondary | ICD-10-CM | POA: Diagnosis not present

## 2021-08-25 DIAGNOSIS — M542 Cervicalgia: Secondary | ICD-10-CM | POA: Diagnosis not present

## 2021-08-25 DIAGNOSIS — M546 Pain in thoracic spine: Secondary | ICD-10-CM | POA: Diagnosis not present

## 2021-08-26 DIAGNOSIS — M9901 Segmental and somatic dysfunction of cervical region: Secondary | ICD-10-CM | POA: Diagnosis not present

## 2021-08-26 DIAGNOSIS — M542 Cervicalgia: Secondary | ICD-10-CM | POA: Diagnosis not present

## 2021-08-26 DIAGNOSIS — M546 Pain in thoracic spine: Secondary | ICD-10-CM | POA: Diagnosis not present

## 2021-08-26 DIAGNOSIS — M9903 Segmental and somatic dysfunction of lumbar region: Secondary | ICD-10-CM | POA: Diagnosis not present

## 2021-08-28 ENCOUNTER — Other Ambulatory Visit (HOSPITAL_COMMUNITY): Payer: Self-pay

## 2021-08-29 ENCOUNTER — Other Ambulatory Visit: Payer: Self-pay

## 2021-08-29 ENCOUNTER — Encounter (HOSPITAL_BASED_OUTPATIENT_CLINIC_OR_DEPARTMENT_OTHER): Payer: Self-pay | Admitting: Physical Therapy

## 2021-08-29 ENCOUNTER — Other Ambulatory Visit (HOSPITAL_COMMUNITY): Payer: Self-pay

## 2021-08-29 ENCOUNTER — Ambulatory Visit (HOSPITAL_BASED_OUTPATIENT_CLINIC_OR_DEPARTMENT_OTHER): Payer: 59 | Admitting: Physical Therapy

## 2021-08-29 DIAGNOSIS — M9901 Segmental and somatic dysfunction of cervical region: Secondary | ICD-10-CM | POA: Diagnosis not present

## 2021-08-29 DIAGNOSIS — R252 Cramp and spasm: Secondary | ICD-10-CM | POA: Diagnosis not present

## 2021-08-29 DIAGNOSIS — M542 Cervicalgia: Secondary | ICD-10-CM

## 2021-08-29 DIAGNOSIS — M546 Pain in thoracic spine: Secondary | ICD-10-CM | POA: Diagnosis not present

## 2021-08-29 DIAGNOSIS — M9903 Segmental and somatic dysfunction of lumbar region: Secondary | ICD-10-CM | POA: Diagnosis not present

## 2021-08-29 DIAGNOSIS — M79605 Pain in left leg: Secondary | ICD-10-CM | POA: Diagnosis not present

## 2021-08-29 DIAGNOSIS — R293 Abnormal posture: Secondary | ICD-10-CM | POA: Diagnosis not present

## 2021-08-29 DIAGNOSIS — R29898 Other symptoms and signs involving the musculoskeletal system: Secondary | ICD-10-CM | POA: Diagnosis not present

## 2021-08-29 MED ORDER — LISINOPRIL 10 MG PO TABS
10.0000 mg | ORAL_TABLET | Freq: Every day | ORAL | 1 refills | Status: DC
  Filled 2021-08-29: qty 90, 90d supply, fill #0
  Filled 2021-11-29: qty 90, 90d supply, fill #1

## 2021-08-29 NOTE — Therapy (Signed)
OUTPATIENT PHYSICAL THERAPY TREATMENT NOTE   Patient Name: Sonya Munoz MRN: [id number] DOB:1966/05/05, 55 y.o., female Today's Date: 08/29/2021  PCP: Marda Stalker, PA-C REFERRING PROVIDER: Marda Stalker, PA-C    Past Medical History:  Diagnosis Date   Anxiety and depression    GERD (gastroesophageal reflux disease)    Insomnia    Past Surgical History:  Procedure Laterality Date   AUGMENTATION MAMMAPLASTY Bilateral 1998   saline    BREAST SURGERY Bilateral    augmentation   fallopian tube repair     RHINOPLASTY     SHOULDER OPEN ROTATOR CUFF REPAIR Left    Patient Active Problem List   Diagnosis Date Noted   Hallux rigidus of right foot 11/04/2020   Right shoulder pain 12/27/2017   PCP: Marda Stalker, PA-C   REFERRING PROVIDER: Lynne Leader MD   REFERRING DIAG: [phone] (ICD-10-CM) - Hamstring tendinitis of left thigh   THERAPY DIAG:  Y09.983 (ICD-10-CM) - Hamstring tendinitis of left thigh   ONSET DATE: 1 year prior. Patient had a PRP injection 5 weeks prior.  SUBJECTIVE:    SUBJECTIVE STATEMENT: Patient reports she is having a little less pain working out. She continues to have pain when she is sitting. Her chair pad may be helping.    PERTINENT HISTORY: Right knee bursitis, anxiety    PAIN:  None right now, but she is not sitting   Todays treatment:     11/1  Manual therapy: IASTYM and STM to mid belly of the hamstring    There-ex:  Reviewed the program she will be doing with her trainer. The only exercise that may be problematic is [place] split squats.    Hamstring:  Prone light resisted knee flexion throughout motion: manual resistance x20  Bridge with band green x20  Hamstring iso on ball x20 Double knee to chest on ball x20  Single leg reach 3x10 at counter      11/7  Manual therapy: IASTYM and STM to mid belly of the hamstring    There-ex:  Reviewed the program she will be doing with her trainer. The only  exercise that may be problematic is [place] split squats.    Hamstring:  Prone light resisted knee flexion throughout motion: manual resistance x20  Bridge with band blue x20  Hamstring iso on ball x20 Double knee to chest on ball x20  [place] 35lbs 3x10                   CLINICAL IMPRESSION: Patient is progressing She has had less pain with her workouts. She continues to have some pain with sitting. She was advised to continue with her current plan. Therapy continues to focus on manual therapy to loosen the hamstirng without over stretching the insertion.    decreased activity tolerance, decreased ROM, decreased strength, impaired flexibility, and pain   Time since onset of injury/illness/exacerbation   Sitting at work; going to the gym      REHAB POTENTIAL: Good   CLINICAL DECISION MAKING: Stable/uncomplicated   EVALUATION COMPLEXITY: Low     GOALS: Goals reviewed with patient? No   SHORT TERM GOALS:   STG Name Target Date Goal status  1 Patient will increase left hamstring length to 100 degrees  Baseline:  09/07/2021 INITIAL  2 Patient will increase left knee flexion strength by 5 lbs Baseline:  09/07/2021 INITIAL  3 Patient will sit for 4 hours without increased pain  Baseline: 09/07/2021 INITIAL  LONG TERM GOALS:  LTG Name Target Date Goal status  1 Patient will sit through work shift without pain  Baseline: 09/28/2021 INITIAL  2 Patient will return to all gym activity without pain  Baseline: 09/28/2021 INITIAL      PLAN: PT FREQUENCY: 2x/week   PT DURATION: 6 weeks   PLANNED INTERVENTIONS: Therapeutic exercises, Therapeutic activity, Neuro Muscular re-education, Balance training, Gait training, Patient/Family education, and Joint mobilization dry needling, aquatic therapy; ionto   PLAN FOR NEXT SESSION: IASTYM to hamstrings; needling to hamstrings; review exercises; consider gym and kettle bell exercises         Carney Living PT DPT   08/29/2021, 3:42 PM

## 2021-08-30 ENCOUNTER — Encounter (HOSPITAL_BASED_OUTPATIENT_CLINIC_OR_DEPARTMENT_OTHER): Payer: Self-pay | Admitting: Physical Therapy

## 2021-08-30 DIAGNOSIS — M9903 Segmental and somatic dysfunction of lumbar region: Secondary | ICD-10-CM | POA: Diagnosis not present

## 2021-08-30 DIAGNOSIS — M542 Cervicalgia: Secondary | ICD-10-CM | POA: Diagnosis not present

## 2021-08-30 DIAGNOSIS — M9901 Segmental and somatic dysfunction of cervical region: Secondary | ICD-10-CM | POA: Diagnosis not present

## 2021-08-30 DIAGNOSIS — M546 Pain in thoracic spine: Secondary | ICD-10-CM | POA: Diagnosis not present

## 2021-08-31 ENCOUNTER — Encounter (HOSPITAL_BASED_OUTPATIENT_CLINIC_OR_DEPARTMENT_OTHER): Payer: Self-pay | Admitting: Physical Therapy

## 2021-08-31 ENCOUNTER — Ambulatory Visit (HOSPITAL_BASED_OUTPATIENT_CLINIC_OR_DEPARTMENT_OTHER): Payer: 59 | Admitting: Physical Therapy

## 2021-08-31 ENCOUNTER — Other Ambulatory Visit: Payer: Self-pay

## 2021-08-31 DIAGNOSIS — R29898 Other symptoms and signs involving the musculoskeletal system: Secondary | ICD-10-CM

## 2021-08-31 DIAGNOSIS — R252 Cramp and spasm: Secondary | ICD-10-CM

## 2021-08-31 DIAGNOSIS — M79605 Pain in left leg: Secondary | ICD-10-CM

## 2021-08-31 DIAGNOSIS — M542 Cervicalgia: Secondary | ICD-10-CM | POA: Diagnosis not present

## 2021-08-31 DIAGNOSIS — R293 Abnormal posture: Secondary | ICD-10-CM | POA: Diagnosis not present

## 2021-08-31 NOTE — Therapy (Signed)
OUTPATIENT PHYSICAL THERAPY TREATMENT NOTE   Patient Name: Sonya Munoz MRN: [id number] DOB:[birth year], 55 y.o., female Today's Date: 08/31/2021  PCP: Marda Stalker, PA-C REFERRING PROVIDER: Marda Stalker, PA-C   PT End of Session - 08/31/21 1432     Visit Number 4    Number of Visits 12    Date for PT Re-Evaluation 09/28/21    PT Start Time 1430    PT Stop Time 1512    PT Time Calculation (min) 42 min    Activity Tolerance Patient tolerated treatment well    Behavior During Therapy WFL for tasks assessed/performed             Past Medical History:  Diagnosis Date   Anxiety and depression    GERD (gastroesophageal reflux disease)    Insomnia    Past Surgical History:  Procedure Laterality Date   AUGMENTATION MAMMAPLASTY Bilateral 1998   saline    BREAST SURGERY Bilateral    augmentation   fallopian tube repair     RHINOPLASTY     SHOULDER OPEN ROTATOR CUFF REPAIR Left    Patient Active Problem List   Diagnosis Date Noted   Hallux rigidus of right foot 11/04/2020   Right shoulder pain 12/27/2017   PCP: Marda Stalker, PA-C   REFERRING PROVIDER: Lynne Leader MD   REFERRING DIAG: 5145239781 (ICD-10-CM) - Hamstring tendinitis of left thigh   THERAPY DIAG:  I43.329 (ICD-10-CM) - Hamstring tendinitis of left thigh   ONSET DATE: 1 year prior. Patient had a PRP injection 5 weeks prior.  SUBJECTIVE:    SUBJECTIVE STATEMENT: Patient reports she is having a little less pain working out. She continues to have pain when she is sitting. Her chair pad may be helping.    PERTINENT HISTORY: Right knee bursitis, anxiety    PAIN:  None right now, but she is not sitting   Todays treatment:     11/1   Manual therapy: IASTYM and STM to mid belly of the hamstring    There-ex:  Reviewed the program she will be doing with her trainer. The only exercise that may be problematic is [place] split squats.    Hamstring:  Prone light resisted knee flexion  throughout motion: manual resistance x20  Bridge with band green x20  Hamstring iso on ball x20 Double knee to chest on ball x20  Single leg reach 3x10 at counter      11/7   Manual therapy: IASTYM and STM to mid belly of the hamstring    There-ex:  Reviewed the program she will be doing with her trainer. The only exercise that may be problematic is [place] split squats.    Hamstring:  Prone light resisted knee flexion throughout motion: manual resistance x20  Bridge with band blue x20  Hamstring iso on ball x20 Double knee to chest on ball x20  Cablewalk 35lbs 3x10   11/9  Manual therapy: IASTYM and STM to mid belly of the hamstring    There-ex:  Reviewed the program she will be doing with her trainer. The only exercise that may be problematic is [place] split squats.    Hamstring:  Bridge with band blue x20  Hamstring iso on ball x20 Double knee to chest on ball x20  Cablewalk 35lbs 3x10   Lateral cable walk x0 each way 10lbs   Chop with cable 2x10 10 lbs  CLINICAL IMPRESSION: Therapy added in rotational stability exercises for the gy, She will take a week and continue to work on her stuff. She was encouraged to roll  her hamstring over the next week with a roller, but not arrestively stretch it. She had no pain with her exercises. Therapy will continue to progress as tolerated  decreased activity tolerance, decreased ROM, decreased strength, impaired flexibility, and pain   Time since onset of injury/illness/exacerbation   Sitting at work; going to the gym      REHAB POTENTIAL: Good   CLINICAL DECISION MAKING: Stable/uncomplicated   EVALUATION COMPLEXITY: Low     GOALS: Goals reviewed with patient? No   SHORT TERM GOALS:   STG Name Target Date Goal status  1 Patient will increase left hamstring length to 100 degrees  Baseline:  09/07/2021 INITIAL  2 Patient will increase left knee flexion strength by 5  lbs Baseline:  09/07/2021 INITIAL  3 Patient will sit for 4 hours without increased pain  Baseline: 09/07/2021 INITIAL  LONG TERM GOALS:    LTG Name Target Date Goal status  1 Patient will sit through work shift without pain  Baseline: 09/28/2021 INITIAL  2 Patient will return to all gym activity without pain  Baseline: 09/28/2021 INITIAL      PLAN: PT FREQUENCY: 2x/week   PT DURATION: 6 weeks   PLANNED INTERVENTIONS: Therapeutic exercises, Therapeutic activity, Neuro Muscular re-education, Balance training, Gait training, Patient/Family education, and Joint mobilization dry needling, aquatic therapy; ionto   PLAN FOR NEXT SESSION: IASTYM to hamstrings; needling to hamstrings; review exercises; consider gym and kettle bell exercises         Carney Living 08/31/2021, 3:01 PM

## 2021-09-01 ENCOUNTER — Encounter (HOSPITAL_BASED_OUTPATIENT_CLINIC_OR_DEPARTMENT_OTHER): Payer: Self-pay | Admitting: Physical Therapy

## 2021-09-01 DIAGNOSIS — M542 Cervicalgia: Secondary | ICD-10-CM | POA: Diagnosis not present

## 2021-09-01 DIAGNOSIS — M9901 Segmental and somatic dysfunction of cervical region: Secondary | ICD-10-CM | POA: Diagnosis not present

## 2021-09-01 DIAGNOSIS — M9903 Segmental and somatic dysfunction of lumbar region: Secondary | ICD-10-CM | POA: Diagnosis not present

## 2021-09-01 DIAGNOSIS — M546 Pain in thoracic spine: Secondary | ICD-10-CM | POA: Diagnosis not present

## 2021-09-05 DIAGNOSIS — M542 Cervicalgia: Secondary | ICD-10-CM | POA: Diagnosis not present

## 2021-09-05 DIAGNOSIS — M9901 Segmental and somatic dysfunction of cervical region: Secondary | ICD-10-CM | POA: Diagnosis not present

## 2021-09-05 DIAGNOSIS — M546 Pain in thoracic spine: Secondary | ICD-10-CM | POA: Diagnosis not present

## 2021-09-05 DIAGNOSIS — M9903 Segmental and somatic dysfunction of lumbar region: Secondary | ICD-10-CM | POA: Diagnosis not present

## 2021-09-06 DIAGNOSIS — M546 Pain in thoracic spine: Secondary | ICD-10-CM | POA: Diagnosis not present

## 2021-09-06 DIAGNOSIS — M542 Cervicalgia: Secondary | ICD-10-CM | POA: Diagnosis not present

## 2021-09-06 DIAGNOSIS — M9901 Segmental and somatic dysfunction of cervical region: Secondary | ICD-10-CM | POA: Diagnosis not present

## 2021-09-06 DIAGNOSIS — M9903 Segmental and somatic dysfunction of lumbar region: Secondary | ICD-10-CM | POA: Diagnosis not present

## 2021-09-07 DIAGNOSIS — Z419 Encounter for procedure for purposes other than remedying health state, unspecified: Secondary | ICD-10-CM | POA: Diagnosis not present

## 2021-09-07 DIAGNOSIS — Z85828 Personal history of other malignant neoplasm of skin: Secondary | ICD-10-CM | POA: Diagnosis not present

## 2021-09-07 DIAGNOSIS — D225 Melanocytic nevi of trunk: Secondary | ICD-10-CM | POA: Diagnosis not present

## 2021-09-07 DIAGNOSIS — L218 Other seborrheic dermatitis: Secondary | ICD-10-CM | POA: Diagnosis not present

## 2021-09-07 DIAGNOSIS — D2271 Melanocytic nevi of right lower limb, including hip: Secondary | ICD-10-CM | POA: Diagnosis not present

## 2021-09-07 DIAGNOSIS — D045 Carcinoma in situ of skin of trunk: Secondary | ICD-10-CM | POA: Diagnosis not present

## 2021-09-07 DIAGNOSIS — L821 Other seborrheic keratosis: Secondary | ICD-10-CM | POA: Diagnosis not present

## 2021-09-08 DIAGNOSIS — M9901 Segmental and somatic dysfunction of cervical region: Secondary | ICD-10-CM | POA: Diagnosis not present

## 2021-09-08 DIAGNOSIS — M9903 Segmental and somatic dysfunction of lumbar region: Secondary | ICD-10-CM | POA: Diagnosis not present

## 2021-09-08 DIAGNOSIS — M546 Pain in thoracic spine: Secondary | ICD-10-CM | POA: Diagnosis not present

## 2021-09-08 DIAGNOSIS — M542 Cervicalgia: Secondary | ICD-10-CM | POA: Diagnosis not present

## 2021-09-12 DIAGNOSIS — M9901 Segmental and somatic dysfunction of cervical region: Secondary | ICD-10-CM | POA: Diagnosis not present

## 2021-09-12 DIAGNOSIS — M9903 Segmental and somatic dysfunction of lumbar region: Secondary | ICD-10-CM | POA: Diagnosis not present

## 2021-09-12 DIAGNOSIS — M542 Cervicalgia: Secondary | ICD-10-CM | POA: Diagnosis not present

## 2021-09-12 DIAGNOSIS — M546 Pain in thoracic spine: Secondary | ICD-10-CM | POA: Diagnosis not present

## 2021-09-13 ENCOUNTER — Ambulatory Visit (HOSPITAL_BASED_OUTPATIENT_CLINIC_OR_DEPARTMENT_OTHER): Payer: 59 | Admitting: Physical Therapy

## 2021-09-13 ENCOUNTER — Other Ambulatory Visit: Payer: Self-pay

## 2021-09-13 DIAGNOSIS — R252 Cramp and spasm: Secondary | ICD-10-CM

## 2021-09-13 DIAGNOSIS — M79605 Pain in left leg: Secondary | ICD-10-CM

## 2021-09-13 DIAGNOSIS — R293 Abnormal posture: Secondary | ICD-10-CM | POA: Diagnosis not present

## 2021-09-13 DIAGNOSIS — R29898 Other symptoms and signs involving the musculoskeletal system: Secondary | ICD-10-CM | POA: Diagnosis not present

## 2021-09-13 DIAGNOSIS — M9903 Segmental and somatic dysfunction of lumbar region: Secondary | ICD-10-CM | POA: Diagnosis not present

## 2021-09-13 DIAGNOSIS — M542 Cervicalgia: Secondary | ICD-10-CM | POA: Diagnosis not present

## 2021-09-13 DIAGNOSIS — M546 Pain in thoracic spine: Secondary | ICD-10-CM | POA: Diagnosis not present

## 2021-09-13 DIAGNOSIS — M9901 Segmental and somatic dysfunction of cervical region: Secondary | ICD-10-CM | POA: Diagnosis not present

## 2021-09-14 ENCOUNTER — Encounter (HOSPITAL_BASED_OUTPATIENT_CLINIC_OR_DEPARTMENT_OTHER): Payer: Self-pay | Admitting: Physical Therapy

## 2021-09-14 DIAGNOSIS — M542 Cervicalgia: Secondary | ICD-10-CM | POA: Diagnosis not present

## 2021-09-14 DIAGNOSIS — M9901 Segmental and somatic dysfunction of cervical region: Secondary | ICD-10-CM | POA: Diagnosis not present

## 2021-09-14 DIAGNOSIS — M9903 Segmental and somatic dysfunction of lumbar region: Secondary | ICD-10-CM | POA: Diagnosis not present

## 2021-09-14 DIAGNOSIS — M546 Pain in thoracic spine: Secondary | ICD-10-CM | POA: Diagnosis not present

## 2021-09-14 NOTE — Therapy (Signed)
OUTPATIENT PHYSICAL THERAPY TREATMENT NOTE   Patient Name: Joelly Bolanos MRN: [id number] DOB:04-06-1966, 55 y.o., female Today's Date: 09/14/2021  PCP: Marda Stalker, PA-C REFERRING PROVIDER: Marda Stalker, PA-C   PT End of Session - 09/14/21 1732     Visit Number 5    Number of Visits 12    Date for PT Re-Evaluation 09/28/21    PT Start Time 2683    PT Stop Time 1558    PT Time Calculation (min) 43 min    Activity Tolerance Patient tolerated treatment well    Behavior During Therapy WFL for tasks assessed/performed             Past Medical History:  Diagnosis Date   Anxiety and depression    GERD (gastroesophageal reflux disease)    Insomnia    Past Surgical History:  Procedure Laterality Date   AUGMENTATION MAMMAPLASTY Bilateral 1998   saline    BREAST SURGERY Bilateral    augmentation   fallopian tube repair     RHINOPLASTY     SHOULDER OPEN ROTATOR CUFF REPAIR Left    Patient Active Problem List   Diagnosis Date Noted   Hallux rigidus of right foot 11/04/2020   Right shoulder pain 12/27/2017  PCP: Marda Stalker, PA-C   REFERRING PROVIDER: Lynne Leader MD   REFERRING DIAG: [id number] (ICD-10-CM) - Hamstring tendinitis of left thigh   THERAPY DIAG:  W97.989 (ICD-10-CM) - Hamstring tendinitis of left thigh   ONSET DATE: 1 year prior. Patient had a PRP injection 5 weeks prior.  SUBJECTIVE:    SUBJECTIVE STATEMENT: Patient reports she is having a little less pain working out. She continues to have pain when she is sitting. Her chair pad may be helping.    PERTINENT HISTORY: Right knee bursitis, anxiety    PAIN:  None right now, but she is not sitting   Todays treatment:         11/7   Manual therapy: IASTYM and STM to mid belly of the hamstring    There-ex:  Reviewed the program she will be doing with her trainer. The only exercise that may be problematic is [place] split squats.    Hamstring:  Prone light resisted knee  flexion throughout motion: manual resistance x20  Bridge with band blue x20  Hamstring iso on ball x20 Double knee to chest on ball x20  Cablewalk 35lbs 3x10    11/9   Manual therapy: IASTYM and STM to mid belly of the hamstring    There-ex:  Reviewed the program she will be doing with her trainer. The only exercise that may be problematic is [place] split squats.    Hamstring:  Bridge with band blue x20  Hamstring iso on ball x20 Double knee to chest on ball x20  Cablewalk 35lbs 3x10    Lateral cable walk x0 each way 10lbs    Chop with cable 2x10 10 lbs   11/23  Manual therapy: IASTYM and STM to mid belly of the hamstring    There-ex:     Hamstring:  Bridge with band blue x20  Hamstring iso on ball x20 Double knee to chest on ball x20  Cablewalk 35lbs 3x10    Lateral cable walk x0 each way 10lbs    Chop with cable 2x10 10 lbs                          CLINICAL IMPRESSION: Patient continues to do about the same.  She dosen't have any tenderness to palpation, but with certain rotational movements she has pain. She was able to complete cable walks with increased pain. She did have minor pain with the ball roll. We continue to focus on lengthening the hamstring without over stretching it. She does have improved passive hamstring motion compared to her eval.    Time since onset of injury/illness/exacerbation   Sitting at work; going to the gym      REHAB POTENTIAL: Good   CLINICAL DECISION MAKING: Stable/uncomplicated   EVALUATION COMPLEXITY: Low     GOALS: Goals reviewed with patient? No   SHORT TERM GOALS:   STG Name Target Date Goal status  1 Patient will increase left hamstring length to 100 degrees  Baseline:  09/07/2021 INITIAL  2 Patient will increase left knee flexion strength by 5 lbs Baseline:  09/07/2021 INITIAL  3 Patient will sit for 4 hours without increased pain  Baseline: 09/07/2021 INITIAL  LONG TERM GOALS:    LTG Name  Target Date Goal status  1 Patient will sit through work shift without pain  Baseline: 09/28/2021 INITIAL  2 Patient will return to all gym activity without pain  Baseline: 09/28/2021 INITIAL      PLAN: PT FREQUENCY: 2x/week   PT DURATION: 6 weeks   PLANNED INTERVENTIONS: Therapeutic exercises, Therapeutic activity, Neuro Muscular re-education, Balance training, Gait training, Patient/Family education, and Joint mobilization dry needling, aquatic therapy; ionto   PLAN FOR NEXT SESSION: IASTYM to hamstrings; needling to hamstrings; review exercises; consider gym and kettle bell exercises         Carney Living PT DPT  09/14/2021, 5:36 PM

## 2021-09-19 ENCOUNTER — Encounter (HOSPITAL_BASED_OUTPATIENT_CLINIC_OR_DEPARTMENT_OTHER): Payer: Self-pay | Admitting: Physical Therapy

## 2021-09-19 ENCOUNTER — Other Ambulatory Visit: Payer: Self-pay

## 2021-09-19 ENCOUNTER — Ambulatory Visit (HOSPITAL_BASED_OUTPATIENT_CLINIC_OR_DEPARTMENT_OTHER): Payer: 59 | Admitting: Physical Therapy

## 2021-09-19 DIAGNOSIS — R252 Cramp and spasm: Secondary | ICD-10-CM | POA: Diagnosis not present

## 2021-09-19 DIAGNOSIS — M79605 Pain in left leg: Secondary | ICD-10-CM | POA: Diagnosis not present

## 2021-09-19 DIAGNOSIS — M542 Cervicalgia: Secondary | ICD-10-CM | POA: Diagnosis not present

## 2021-09-19 DIAGNOSIS — R293 Abnormal posture: Secondary | ICD-10-CM | POA: Diagnosis not present

## 2021-09-19 DIAGNOSIS — R29898 Other symptoms and signs involving the musculoskeletal system: Secondary | ICD-10-CM | POA: Diagnosis not present

## 2021-09-19 NOTE — Therapy (Signed)
OUTPATIENT PHYSICAL THERAPY TREATMENT NOTE   Patient Name: Sonya Munoz MRN: [id number] DOB:[birth year], 55 y.o., female Today's Date: 09/19/2021  PCP: Marda Stalker, PA-C REFERRING PROVIDER: Marda Stalker, PA-C   PT End of Session - 09/19/21 1331     Visit Number 6    Number of Visits 12    Date for PT Re-Evaluation 09/28/21    PT Start Time 1300    PT Stop Time 8563    PT Time Calculation (min) 38 min    Activity Tolerance Patient tolerated treatment well    Behavior During Therapy WFL for tasks assessed/performed             Past Medical History:  Diagnosis Date   Anxiety and depression    GERD (gastroesophageal reflux disease)    Insomnia    Past Surgical History:  Procedure Laterality Date   AUGMENTATION MAMMAPLASTY Bilateral 1998   saline    BREAST SURGERY Bilateral    augmentation   fallopian tube repair     RHINOPLASTY     SHOULDER OPEN ROTATOR CUFF REPAIR Left    Patient Active Problem List   Diagnosis Date Noted   Hallux rigidus of right foot 11/04/2020   Right shoulder pain 12/27/2017   PCP: Marda Stalker, PA-C   REFERRING PROVIDER: Lynne Leader MD   REFERRING DIAG: [phone] (ICD-10-CM) - Hamstring tendinitis of left thigh   THERAPY DIAG:  O37.858 (ICD-10-CM) - Hamstring tendinitis of left thigh   ONSET DATE: 1 year prior. Patient had a PRP injection 5 weeks prior.  SUBJECTIVE:    SUBJECTIVE STATEMENT: No significant change. She feels like she has lost some motion in her hamstring over the past few days. She has been working on her exercises but nothing is really changing. PERTINENT HISTORY: Right knee bursitis, anxiety    PAIN:  None right now, but she is not sitting   Todays treatment:         11/7   Manual therapy: IASTYM and STM to mid belly of the hamstring    There-ex:  Reviewed the program she will be doing with her trainer. The only exercise that may be problematic is [place] split squats.     Hamstring:  Prone light resisted knee flexion throughout motion: manual resistance x20  Bridge with band blue x20  Hamstring iso on ball x20 Double knee to chest on ball x20  Cablewalk 35lbs 3x10    11/9   Manual therapy: IASTYM and STM to mid belly of the hamstring    There-ex:  Reviewed the program she will be doing with her trainer. The only exercise that may be problematic is [place] split squats.    Hamstring:  Bridge with band blue x20  Hamstring iso on ball x20 Double knee to chest on ball x20  Cablewalk 35lbs 3x10    Lateral cable walk x0 each way 10lbs    Chop with cable 2x10 10 lbs    11/23   Manual therapy: IASTYM and STM to mid belly of the hamstring    There-ex:      Hamstring:  Bridge with band blue x20  Hamstring iso on ball x20 Double knee to chest on ball x20  Cablewalk 35lbs 3x10    Lateral cable walk x0 each way 10lbs    Chop with cable 2x10 10 lbs     11/28  Manual therapy: IASTYM and STM to mid belly of the hamstring   TPDN: to piriformis 2nd to potential for referral to  the gluteal. Consent given 2 spots needled.   Manual therapy: IASTYM and STM to mid belly of the hamstring     Chop with cable 2x10 10 lbs   Cone drill to table 2x10   Pallof Press 2x10 10lbs each direction                       CLINICAL IMPRESSION:   Therapy trailed  needling her piriformis to see if some of the referral could be coming from that area. She had a great twitch response. She reports her piriformis is always tight. Nothing really seems to make her symptoms too much better or worse. We worked on rotational stability exercises without an increase or decrease in pain. She will return to her MD next week.      Time since onset of injury/illness/exacerbation   Sitting at work; going to the gym      REHAB POTENTIAL: Good   CLINICAL DECISION MAKING: Stable/uncomplicated   EVALUATION COMPLEXITY: Low     GOALS: Goals reviewed with  patient? No   SHORT TERM GOALS:   STG Name Target Date Goal status  1 Patient will increase left hamstring length to 100 degrees  Baseline:  09/07/2021 INITIAL  2 Patient will increase left knee flexion strength by 5 lbs Baseline:  09/07/2021 INITIAL  3 Patient will sit for 4 hours without increased pain  Baseline: 09/07/2021 INITIAL  LONG TERM GOALS:    LTG Name Target Date Goal status  1 Patient will sit through work shift without pain  Baseline: 09/28/2021 INITIAL  2 Patient will return to all gym activity without pain  Baseline: 09/28/2021 INITIAL      PLAN: PT FREQUENCY: 2x/week   PT DURATION: 6 weeks   PLANNED INTERVENTIONS: Therapeutic exercises, Therapeutic activity, Neuro Muscular re-education, Balance training, Gait training, Patient/Family education, and Joint mobilization dry needling, aquatic therapy; ionto   PLAN FOR NEXT SESSION: IASTYM to hamstrings; needling to hamstrings; review exercises; consider gym and kettle bell exercises         Carney Living PT DPT  09/19/2021, 1:33 PM

## 2021-09-21 ENCOUNTER — Other Ambulatory Visit: Payer: Self-pay

## 2021-09-21 ENCOUNTER — Encounter (HOSPITAL_BASED_OUTPATIENT_CLINIC_OR_DEPARTMENT_OTHER): Payer: Self-pay | Admitting: Physical Therapy

## 2021-09-21 ENCOUNTER — Ambulatory Visit (HOSPITAL_BASED_OUTPATIENT_CLINIC_OR_DEPARTMENT_OTHER): Payer: 59 | Admitting: Physical Therapy

## 2021-09-21 DIAGNOSIS — R29898 Other symptoms and signs involving the musculoskeletal system: Secondary | ICD-10-CM

## 2021-09-21 DIAGNOSIS — R252 Cramp and spasm: Secondary | ICD-10-CM

## 2021-09-21 DIAGNOSIS — M79605 Pain in left leg: Secondary | ICD-10-CM

## 2021-09-21 DIAGNOSIS — M542 Cervicalgia: Secondary | ICD-10-CM

## 2021-09-21 DIAGNOSIS — R293 Abnormal posture: Secondary | ICD-10-CM

## 2021-09-21 NOTE — Progress Notes (Signed)
I, [name], LAT, ATC, am serving as scribe for Dr. Lynne Leader.  Sonya Munoz is a 55 y.o. female who presents to [place] at [facility] today for f/u of L proximal hamstring pain following PRP injection on 07/15/21.  She was last seen by Dr. Georgina Snell on 07/15/21 for PRP and was referred to PT of which she has completed 6 sessions.  Prior to receiving a PRP injection, the pt had an ischial tuberosity steroid injection on 05/27/21, tried nitroglycerin patches and had a prior course of PT.  Today, pt reports some improvement in her symptoms especially w/ walking and RDL-type movements but con't to have no change in her pain w/ sitting.  She can do the elliptical and walking up slight inclines.  She has tried a Herbalist at work but is not able to tolerate that. She does have an ischial tuberosity off loader cushion that she uses at work.  She does note some leg weakness.  Diagnostic imaging: L femur MRI- 05/15/21; L hip XR- 03/17/21  Pertinent review of systems: No fevers or chills  Relevant historical information: History of musculoskeletal pain in the past.  Lisinopril.   Exam:  BP 102/66 (BP Location: Left Arm, Patient Position: Sitting, Cuff Size: Normal)   Pulse 79   Ht 5\' 7"  (1.702 m)   Wt 140 lb 12.8 oz (63.9 kg)   SpO2 97%   BMI 22.05 kg/m  General: Well Developed, well nourished, and in no acute distress.   MSK: Left hip normal-appearing tender palpation ischial tuberosity. Positive slump test. Lower extremity strength is diminished and discussed below. L-spine: Nontender midline. Normal lumbar motion. Reflexes intact. Strength: Hip flexion 4/5 left.  5/5 right.Marland Kitchen   Marland KitchenHip abduction 5/5 bilaterally. Hip adduction and 5/5 bilaterally. Knee extension 5/5 bilaterally. Knee flexion 4/5 left.  5/5 right. Foot dorsiflexion and plantar flexion 5/5 bilaterally.    Lab and Radiology Results  X-ray images L-spine obtained today personally and  independently interpreted Significant DDD L4-L5.  Moderate DDD L3-L4 L5-S1.  No acute fractures. Await formal radiology review  EXAM: MR OF THE LEFT FEMUR WITHOUT CONTRAST   TECHNIQUE: Multiplanar, multisequence MR imaging of the left femur was performed. No intravenous contrast was administered.   COMPARISON:  Plain films left hip 03/17/2021.   FINDINGS: Bones/Joint/Cartilage   Marrow signal is normal throughout without fracture, stress change or focal lesion. No subchondral cyst formation or edema about the hip or knee. No avascular necrosis of the femoral heads. No hip or knee effusion.   Ligaments   Normal.   Muscles and Tendons   No tear is identified. There is mild edema about the hamstrings bilaterally which is somewhat worse on the left compatible with tendinosis. Please note that the right hamstring origin is visualized in the coronal plane only. No muscle atrophy or focal lesion.   Soft tissues   Negative for mass. The patient has a Baker's cyst of the left knee measuring approximately 2.2 cm AP x 1.2 cm transverse by 3 cm craniocaudal.   IMPRESSION: Negative for muscle or tendon tear. Mild edema at the hamstring origins bilaterally is greater on the left and compatible with tendinosis or strain.   Small Baker's cyst left knee.     Electronically Signed   By: Inge Rise M.D.   On: 05/16/2021 10:38     Assessment and Plan: 55 y.o. female with left posterior hip pain near ischial tuberosity.  Pain is most consistent with proximal  hamstring strain.  However she has not improved in a year of conservative management and her femur MRI from July only shows mild inflammation in this region. She has had extensive physical therapy and home exercise program.  Additionally she has had trials of steroid injection and PRP injection at the ischial tuberosity area with moderate improvement.  Plan to reassess the ischial tuberosity area with a left hip MRI.   This should give [place] better views of the hamstring insertion area and may show more changes or injury than was originally seen on the femur MRI from July.  Additionally plan on lumbar MRI as it is possible that her pain is due to an unusual presentation of lumbosacral radiculopathy.  She does have considerable DDD lumbar spine x-ray per my read.  Await recheck after MRIs are back. Goal of MRI is to hopefully modify treatment potentially injection or modification of physical therapy.   PDMP not reviewed this encounter. Orders Placed This Encounter  Procedures   DG Lumbar Spine 2-3 Views    Standing Status:   Future    Number of Occurrences:   1    Standing Expiration Date:   10/23/2021    Order Specific Question:   Reason for Exam (SYMPTOM  OR DIAGNOSIS REQUIRED)    Answer:   low back pain    Order Specific Question:   Is patient pregnant?    Answer:   No    Order Specific Question:   Preferred imaging location?    Answer:   [facility]   MR Lumbar Spine Wo Contrast    Standing Status:   Future    Standing Expiration Date:   09/22/2022    Order Specific Question:   What is the patient's sedation requirement?    Answer:   No Sedation    Order Specific Question:   Does the patient have a pacemaker or implanted devices?    Answer:   No    Order Specific Question:   Preferred imaging location?    Answer:   Product/process development scientist (table limit-350lbs)   MR HIP LEFT WO CONTRAST    Eval proximal hamstring tendonitis.    Standing Status:   Future    Standing Expiration Date:   09/22/2022    Order Specific Question:   What is the patient's sedation requirement?    Answer:   No Sedation    Order Specific Question:   Does the patient have a pacemaker or implanted devices?    Answer:   No    Order Specific Question:   Preferred imaging location?    Answer:   Product/process development scientist (table limit-350lbs)   No orders of the defined types were placed in this encounter.    Discussed  warning signs or symptoms. Please see discharge instructions. Patient expresses understanding.   The above documentation has been reviewed and is accurate and complete Lynne Leader, M.D.   Total encounter time 30 minutes including face-to-face time with the patient and, reviewing past medical record, and charting on the date of service.   Treatment plan and options

## 2021-09-21 NOTE — Therapy (Addendum)
OUTPATIENT PHYSICAL THERAPY TREATMENT NOTE/Discharge    Patient Name: Sonya Munoz MRN: [id number] DOB:1966-05-10, 55 y.o., female Today's Date: 09/21/2021  PCP: Marda Stalker, PA-C REFERRING PROVIDER: Marda Stalker, PA-C   PT End of Session - 09/21/21 2107     Visit Number 7    Number of Visits 12    Date for PT Re-Evaluation 09/28/21    PT Start Time 1600    PT Stop Time 1630    PT Time Calculation (min) 30 min    Activity Tolerance Patient tolerated treatment well    Behavior During Therapy WFL for tasks assessed/performed             Past Medical History:  Diagnosis Date   Anxiety and depression    GERD (gastroesophageal reflux disease)    Insomnia    Past Surgical History:  Procedure Laterality Date   AUGMENTATION MAMMAPLASTY Bilateral 1998   saline    BREAST SURGERY Bilateral    augmentation   fallopian tube repair     RHINOPLASTY     SHOULDER OPEN ROTATOR CUFF REPAIR Left    Patient Active Problem List   Diagnosis Date Noted   Hallux rigidus of right foot 11/04/2020   Right shoulder pain 12/27/2017   PCP: Marda Stalker, PA-C   REFERRING PROVIDER: Lynne Leader MD   REFERRING DIAG: [phone] (ICD-10-CM) - Hamstring tendinitis of left thigh   THERAPY DIAG:  T51.761 (ICD-10-CM) - Hamstring tendinitis of left thigh   ONSET DATE: 1 year prior. Patient had a PRP injection 5 weeks prior.  SUBJECTIVE:    SUBJECTIVE STATEMENT: Patient has had no significant improvement with needling of the piriformis. She reports everything is about the same.  PERTINENT HISTORY: Right knee bursitis, anxiety    PAIN:  None right now, but she is not sitting   Todays treatment:         11/7   Manual therapy: IASTYM and STM to mid belly of the hamstring    There-ex:  Reviewed the program she will be doing with her trainer. The only exercise that may be problematic is [place] split squats.    Hamstring:  Prone light resisted knee flexion  throughout motion: manual resistance x20  Bridge with band blue x20  Hamstring iso on ball x20 Double knee to chest on ball x20  Cablewalk 35lbs 3x10    11/9   Manual therapy: IASTYM and STM to mid belly of the hamstring    There-ex:  Reviewed the program she will be doing with her trainer. The only exercise that may be problematic is [place] split squats.    Hamstring:  Bridge with band blue x20  Hamstring iso on ball x20 Double knee to chest on ball x20  Cablewalk 35lbs 3x10    Lateral cable walk x0 each way 10lbs    Chop with cable 2x10 10 lbs    11/23   Manual therapy: IASTYM and STM to mid belly of the hamstring    There-ex:      Hamstring:  Bridge with band blue x20  Hamstring iso on ball x20 Double knee to chest on ball x20  Cablewalk 35lbs 3x10    Lateral cable walk x0 each way 10lbs    Chop with cable 2x10 10 lbs     11/28   Manual therapy: IASTYM and STM to mid belly of the hamstring    TPDN: to piriformis 2nd to potential for referral to the gluteal. Consent given 2 spots needled.  Manual therapy: IASTYM and STM to mid belly of the hamstring      Chop with cable 2x10 10 lbs    Cone drill to table 2x10    Pallof Press 2x10 10lbs each direction   11/30  Manual therapy: IASTYM and STM to mid belly of the hamstring  Roller to gluteal; trigger point release to ischial area without finding a trigger point  LAD grade 4 without change in pain   Reviewed activity progression going forward.                          CLINICAL IMPRESSION:   Patient continues to have no significant improvement. She will return to the MD tomorrow. She will see what the pan is going forward. She will continue her exercises at home. We will keep her open for the time being until a further plan is developed.       Time since onset of injury/illness/exacerbation   Sitting at work; going to the gym      REHAB POTENTIAL: Good   CLINICAL DECISION MAKING:  Stable/uncomplicated   EVALUATION COMPLEXITY: Low     GOALS: Goals reviewed with patient? No   SHORT TERM GOALS:   STG Name Target Date Goal status  1 Patient will increase left hamstring length to 100 degrees  Baseline:  09/07/2021 INITIAL  2 Patient will increase left knee flexion strength by 5 lbs Baseline:  09/07/2021 INITIAL  3 Patient will sit for 4 hours without increased pain  Baseline: 09/07/2021 INITIAL  LONG TERM GOALS:    LTG Name Target Date Goal status  1 Patient will sit through work shift without pain  Baseline: 09/28/2021 INITIAL  2 Patient will return to all gym activity without pain  Baseline: 09/28/2021 INITIAL      PLAN: PT FREQUENCY: 2x/week   PT DURATION: 6 weeks   PLANNED INTERVENTIONS: Therapeutic exercises, Therapeutic activity, Neuro Muscular re-education, Balance training, Gait training, Patient/Family education, and Joint mobilization dry needling, aquatic therapy; ionto   PLAN FOR NEXT SESSION: IASTYM to hamstrings; needling to hamstrings; review exercises; consider gym and kettle bell exercises    PHYSICAL THERAPY DISCHARGE SUMMARY  Visits from Start of Care: 7  Current functional level related to goals / functional outcomes: Continued pain in her hamstring area    Remaining deficits: Continued pain    Education / Equipment: HEP    Patient agrees to discharge. Patient goals were not met. Patient is being discharged due to lack of progress.   David J Carroll PT DPT  09/21/2021, 9:11 PM     

## 2021-09-22 ENCOUNTER — Encounter: Payer: Self-pay | Admitting: Family Medicine

## 2021-09-22 ENCOUNTER — Ambulatory Visit (INDEPENDENT_AMBULATORY_CARE_PROVIDER_SITE_OTHER): Payer: 59

## 2021-09-22 ENCOUNTER — Ambulatory Visit (INDEPENDENT_AMBULATORY_CARE_PROVIDER_SITE_OTHER): Payer: 59 | Admitting: Family Medicine

## 2021-09-22 VITALS — BP 102/66 | HR 79 | Ht 67.0 in | Wt 140.8 lb

## 2021-09-22 DIAGNOSIS — M545 Low back pain, unspecified: Secondary | ICD-10-CM

## 2021-09-22 DIAGNOSIS — M76892 Other specified enthesopathies of left lower limb, excluding foot: Secondary | ICD-10-CM

## 2021-09-22 NOTE — Patient Instructions (Addendum)
Good to see you.  Please get an Xray today before you leave.  Happy Holidays!  Follow-up: after MRIs.  Call to schedule follow-up once you see results in MyChart.

## 2021-09-26 NOTE — Progress Notes (Signed)
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

## 2021-10-01 ENCOUNTER — Other Ambulatory Visit: Payer: Self-pay

## 2021-10-01 ENCOUNTER — Ambulatory Visit (INDEPENDENT_AMBULATORY_CARE_PROVIDER_SITE_OTHER): Payer: 59

## 2021-10-01 DIAGNOSIS — M5416 Radiculopathy, lumbar region: Secondary | ICD-10-CM | POA: Diagnosis not present

## 2021-10-01 DIAGNOSIS — M76892 Other specified enthesopathies of left lower limb, excluding foot: Secondary | ICD-10-CM

## 2021-10-01 DIAGNOSIS — M25552 Pain in left hip: Secondary | ICD-10-CM | POA: Diagnosis not present

## 2021-10-01 DIAGNOSIS — S76312A Strain of muscle, fascia and tendon of the posterior muscle group at thigh level, left thigh, initial encounter: Secondary | ICD-10-CM | POA: Diagnosis not present

## 2021-10-01 DIAGNOSIS — M545 Low back pain, unspecified: Secondary | ICD-10-CM

## 2021-10-01 DIAGNOSIS — M79605 Pain in left leg: Secondary | ICD-10-CM | POA: Diagnosis not present

## 2021-10-03 NOTE — Progress Notes (Signed)
Lumbar spine MRI shows some mild stenosis at L4-L5. Does not look like the lumbar spine is the source of the buttocks pain.

## 2021-10-03 NOTE — Progress Notes (Signed)
Left hip MRI shows medium tendinitis of the left hamstring with a small partial-thickness tear.  You and I have an appointment scheduled for 14 December or we can talk about this in more detail and potentially proceed with steroid injection or PRP injection

## 2021-10-05 ENCOUNTER — Other Ambulatory Visit: Payer: Self-pay

## 2021-10-05 ENCOUNTER — Ambulatory Visit: Payer: Self-pay

## 2021-10-05 ENCOUNTER — Ambulatory Visit (INDEPENDENT_AMBULATORY_CARE_PROVIDER_SITE_OTHER): Payer: 59 | Admitting: Family Medicine

## 2021-10-05 VITALS — BP 112/74 | HR 72 | Ht 67.0 in | Wt 143.6 lb

## 2021-10-05 DIAGNOSIS — M76892 Other specified enthesopathies of left lower limb, excluding foot: Secondary | ICD-10-CM

## 2021-10-05 NOTE — Progress Notes (Signed)
I, Peterson Lombard, LAT, ATC acting as a scribe for Lynne Leader, MD.  Sonya Munoz is a 55 y.o. female who presents to [place] at [facility] today for f/u L posterior hip pain. Pt had a PRP injection on 07/15/21 and an ischial tuberosity steroid injection on 05/27/21. Pt has tried PT, completing 7 visits. Pt was last seen by Dr. Georgina Snell on 09/22/21 and was advised to proceed to L-spine and L hip MRI to further characterize the cause of her pain. Today, pt reports feels like the pain is getting worse. Pt is miserable esp sitting at work.  She would like a steroid injection today and any other treatment if possible.  Dx imaging: 10/01/21 L-spine & L hip MRI  09/22/21 L-spine XR  05/15/21 L femur XR  03/17/21 L hip XR   Pertinent review of systems: No fevers or chills  Relevant historical information: Hypertension   Exam:  BP 112/74    Pulse 72    Ht 5\' 7"  (1.702 m)    Wt 143 lb 9.6 oz (65.1 kg)    SpO2 98%    BMI 22.49 kg/m  General: Well Developed, well nourished, and in no acute distress.   MSK: Left buttocks.  Tender palpation ischial tuberosity.  Pain with resisted knee flexion.    Lab and Radiology Results  Procedure: Real-time Ultrasound Guided Injection of hamstring insertion and ischial bursa at ischial tuberosity. Device: Philips Affiniti 50G Images permanently stored and available for review in PACS Verbal informed consent obtained.  Discussed risks and benefits of procedure. Warned about infection bleeding damage to structures skin hypopigmentation and fat atrophy among others. Patient expresses understanding and agreement Time-out conducted.   Noted no overlying erythema, induration, or other signs of local infection.   Skin prepped in a sterile fashion.   Local anesthesia: Topical Ethyl chloride.   With sterile technique and under real time ultrasound guidance: 40 mg of Kenalog and 2 mL of lidocaine injected into ischial tuberosity bursa and  hamstring insertion. Fluid seen entering the hamstring insertion/bursa.   Completed without difficulty   Pain immediately resolved suggesting accurate placement of the medication.   Advised to call if fevers/chills, erythema, induration, drainage, or persistent bleeding.   Images permanently stored and available for review in the ultrasound unit.  Impression: Technically successful ultrasound guided injection.   EXAM: MR OF THE LEFT HIP WITHOUT CONTRAST   TECHNIQUE: Multiplanar, multisequence MR imaging was performed. No intravenous contrast was administered.   COMPARISON:  None.   FINDINGS: Bones:   No hip fracture, dislocation or avascular necrosis.   No periosteal reaction or bone destruction. No aggressive osseous lesion.   Normal sacrum and sacroiliac joints. No SI joint widening or erosive changes.   Degenerative disease with disc height loss at L3-4 L4-5.   Articular cartilage and labrum   Articular cartilage:  No chondral defect.   Labrum: Grossly intact, but evaluation is limited by lack of intraarticular fluid.   Joint or bursal effusion   Joint effusion:  No hip joint effusion.  No SI joint effusion.   Bursae:  No bursa formation.   Muscles and tendons   Flexors: Normal.   Extensors: Normal.   Abductors: Normal.   Adductors: Normal.   Gluteals: Normal.   Hamstrings: Moderate tendinosis of the left hamstring origin with a small partial-thickness tear. Tiny partial-thickness tear of the right hamstring origin.   Other findings   No pelvic free fluid. No fluid collection or  hematoma. No inguinal lymphadenopathy. No inguinal hernia.   IMPRESSION: 1. No hip fracture, dislocation or avascular necrosis. 2. Moderate tendinosis of the left hamstring origin with a small partial-thickness tear. 3. Tiny partial-thickness tear of the right hamstring origin.     Electronically Signed   By: Kathreen Devoid M.D.   On: 10/02/2021 18:47   EXAM: MRI  LUMBAR SPINE WITHOUT CONTRAST   TECHNIQUE: Multiplanar, multisequence MR imaging of the lumbar spine was performed. No intravenous contrast was administered.   COMPARISON:  Lumbar radiographs 09/22/2021   FINDINGS: Segmentation:  5 lumbar segments   Alignment:  Mild retrolisthesis L2-3 and L3-4.   Vertebrae: Normal bone marrow. Negative for fracture or mass. Mild discogenic bone marrow edema at L3-4 L4-5.   Conus medullaris and cauda equina: Conus extends to the T12-L1 level. Conus and cauda equina appear normal.   Paraspinal and other soft tissues: Negative for paraspinous mass, adenopathy, or fluid collection.   Disc levels:   L1-2: Negative   L2-3: Moderate disc degeneration with disc space narrowing. Shallow central and right-sided disc protrusion with down turning of disc material. Mild right subarticular stenosis. Spinal canal adequate in diameter   L3-4: Shallow central disc protrusion. Spinal canal adequate in size. Mild subarticular stenosis on the right.   L4-5: Advanced disc degeneration. Disc space narrowing diffuse endplate spurring. Mild left subarticular stenosis. Spinal canal adequate in size   L5-S1: Negative   IMPRESSION: Shallow central and right-sided disc protrusion at L2-3. Mild right subarticular stenosis   Mild right subarticular stenosis at L3-4.   Mild left subarticular stenosis L4-5 due to spurring.     Electronically Signed   By: Franchot Gallo M.D.   On: 10/02/2021 15:17  I, Lynne Leader, personally (independently) visualized and performed the interpretation of the images attached in this note.   Assessment and Plan: 55 y.o. female with persistent left buttocks pain.  Patient has had this pain ongoing for about a year now.  She has had extensive treatment and evaluation over the last year or so.  She is had trials of physical therapy, steroid injection, PRP injection and not improved sufficiently.  She had a repeat hip and lumbar  spine MRI recently to confirm the diagnosis before proceeding with further treatment which did in my opinion confirm diagnosis of insertional hamstring tendinitis/small hamstring tear.  Fundamentally I do not understand why she has not improved.  I think she is complying with the treatment, performing home exercise program and basically doing everything she should be doing to get better.  Discussed options.  Plan for steroid injection today.  She is having a fair amount of pain and would like some at least temporary relief which is reasonable.  We will refer her to my colleague Dr. Clearance Coots to try extracorporeal shockwave therapy which may be helpful.  Additionally this may be a second opinion visit to see if any other treatment options are available that I am overlooking.  Ultimately surgical consultation may be beneficial however but the patient and myself think that she is not a great surgical candidate.   PDMP not reviewed this encounter. Orders Placed This Encounter  Procedures   [place] LIMITED JOINT SPACE STRUCTURES LOW LEFT(NO LINKED CHARGES)    Standing Status:   Future    Number of Occurrences:   1    Standing Expiration Date:   04/05/2022    Order Specific Question:   Reason for Exam (SYMPTOM  OR DIAGNOSIS REQUIRED)    Answer:  left hip pain    Order Specific Question:   Preferred imaging location?    Answer:   [place]   AMB referral to sports medicine    Referral Priority:   Routine    Referral Type:   Consultation    Number of Visits Requested:   1   No orders of the defined types were placed in this encounter.    Discussed warning signs or symptoms. Please see discharge instructions. Patient expresses understanding.   The above documentation has been reviewed and is accurate and complete Lynne Leader, M.D.

## 2021-10-05 NOTE — Patient Instructions (Addendum)
Thank you for coming in today.   You received a steroid injection today. Seek immediate medical attention if the joint becomes red, extremely painful, or is oozing fluid.   Contact Dr Raeford Razor to get set up for shockwave.   Let me know.

## 2021-10-06 DIAGNOSIS — M76892 Other specified enthesopathies of left lower limb, excluding foot: Secondary | ICD-10-CM | POA: Insufficient documentation

## 2021-10-11 ENCOUNTER — Encounter: Payer: Self-pay | Admitting: Family Medicine

## 2021-10-11 ENCOUNTER — Ambulatory Visit (INDEPENDENT_AMBULATORY_CARE_PROVIDER_SITE_OTHER): Payer: 59 | Admitting: Family Medicine

## 2021-10-11 DIAGNOSIS — M76892 Other specified enthesopathies of left lower limb, excluding foot: Secondary | ICD-10-CM

## 2021-10-11 NOTE — Patient Instructions (Signed)
Nice to meet you Please work on the lifts we discussed   Please send me a message in [place] with any questions or updates.  Please see me back in 1 week for shockwave .   --Dr. Raeford Razor

## 2021-10-11 NOTE — Progress Notes (Signed)
°  Sonya Munoz - 55 y.o. female MRN [id number]  Date of birth: September 17, 1966  SUBJECTIVE:  Including CC & ROS.  No chief complaint on file.   Sonya Munoz is a 55 y.o. female that is presenting with acute on chronic hamstring pain.  Has been ongoing for about a year.  She has tried different injections as well as different medications..   Review of Systems See HPI   HISTORY: Past Medical, Surgical, Social, and Family History Reviewed & Updated per EMR.   Pertinent Historical Findings include:  Past Medical History:  Diagnosis Date   Anxiety and depression    GERD (gastroesophageal reflux disease)    Insomnia     Past Surgical History:  Procedure Laterality Date   AUGMENTATION MAMMAPLASTY Bilateral 1998   saline    BREAST SURGERY Bilateral    augmentation   fallopian tube repair     RHINOPLASTY     SHOULDER OPEN ROTATOR CUFF REPAIR Left     History reviewed. No pertinent family history.  Social History   Socioeconomic History   Marital status: Divorced    Spouse name: Not on file   Number of children: Not on file   Years of education: Not on file   Highest education level: Not on file  Occupational History   Not on file  Tobacco Use   Smoking status: Never   Smokeless tobacco: Never  Substance and Sexual Activity   Alcohol use: Yes   Drug use: Not on file   Sexual activity: Not on file  Other Topics Concern   Not on file  Social History Narrative   Not on file   Social Determinants of Health   Financial Resource Strain: Not on file  Food Insecurity: Not on file  Transportation Needs: Not on file  Physical Activity: Not on file  Stress: Not on file  Social Connections: Not on file  Intimate Partner Violence: Not on file     PHYSICAL EXAM:  VS: Ht 5\' 7"  (1.702 m)    Wt 143 lb (64.9 kg)    BMI 22.40 kg/m  Physical Exam Gen: NAD, alert, cooperative with exam, well-appearing   ECSWT Note Ersa Delaney 12/16/1965  Procedure:  ECSWT Indications: Left hamstring pain  Procedure Details Consent: Risks of procedure as well as the alternatives and risks of each were explained to the (patient/caregiver).  Consent for procedure obtained. Time Out: Verified patient identification, verified procedure, site/side was marked, verified correct patient position, special equipment/implants available, medications/allergies/relevent history reviewed, required imaging and test results available.  Performed.  The area was cleaned with iodine and alcohol swabs.    The left hamstring origin was targeted for Extracorporeal shockwave therapy.   Preset: Trochanteric bursitis Power Level: 100 Frequency: 10 Impulse/cycles: 2800 Head size: Large Session: First  Patient did tolerate procedure well.     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  Symptoms are most prevalent with sitting.  Has tried different injections. -Counseled on home exercise therapy and supportive care. -Shockwave today.

## 2021-10-11 NOTE — Assessment & Plan Note (Signed)
Acute on chronic in nature.  Symptoms are most prevalent with sitting.  Has tried different injections. -Counseled on home exercise therapy and supportive care. -Shockwave today.

## 2021-10-18 ENCOUNTER — Other Ambulatory Visit (HOSPITAL_COMMUNITY): Payer: Self-pay

## 2021-10-19 ENCOUNTER — Encounter: Payer: Self-pay | Admitting: Family Medicine

## 2021-10-19 ENCOUNTER — Ambulatory Visit (INDEPENDENT_AMBULATORY_CARE_PROVIDER_SITE_OTHER): Payer: Self-pay | Admitting: Family Medicine

## 2021-10-19 DIAGNOSIS — M76892 Other specified enthesopathies of left lower limb, excluding foot: Secondary | ICD-10-CM

## 2021-10-19 NOTE — Assessment & Plan Note (Signed)
Completed shockwave therapy  

## 2021-10-19 NOTE — Progress Notes (Signed)
°  Sonya Munoz - 55 y.o. female MRN [id number]  Date of birth: 1966-01-02  SUBJECTIVE:  Including CC & ROS.  No chief complaint on file.   Sonya Munoz is a 55 y.o. female that is here for shockwave therapy.    Review of Systems See HPI   HISTORY: Past Medical, Surgical, Social, and Family History Reviewed & Updated per EMR.   Pertinent Historical Findings include:  Past Medical History:  Diagnosis Date   Anxiety and depression    GERD (gastroesophageal reflux disease)    Insomnia     Past Surgical History:  Procedure Laterality Date   AUGMENTATION MAMMAPLASTY Bilateral 1998   saline    BREAST SURGERY Bilateral    augmentation   fallopian tube repair     RHINOPLASTY     SHOULDER OPEN ROTATOR CUFF REPAIR Left     History reviewed. No pertinent family history.  Social History   Socioeconomic History   Marital status: Divorced    Spouse name: Not on file   Number of children: Not on file   Years of education: Not on file   Highest education level: Not on file  Occupational History   Not on file  Tobacco Use   Smoking status: Never   Smokeless tobacco: Never  Substance and Sexual Activity   Alcohol use: Yes   Drug use: Not on file   Sexual activity: Not on file  Other Topics Concern   Not on file  Social History Narrative   Not on file   Social Determinants of Health   Financial Resource Strain: Not on file  Food Insecurity: Not on file  Transportation Needs: Not on file  Physical Activity: Not on file  Stress: Not on file  Social Connections: Not on file  Intimate Partner Violence: Not on file     PHYSICAL EXAM:  VS: Ht 5\' 7"  (1.702 m)    Wt 143 lb (64.9 kg)    BMI 22.40 kg/m  Physical Exam Gen: NAD, alert, cooperative with exam, well-appearing   ECSWT Note Sonya Munoz August 03, 1966  Procedure: ECSWT Indications: left hamstring   Procedure Details Consent: Risks of procedure as well as the alternatives and risks of each  were explained to the (patient/caregiver).  Consent for procedure obtained. Time Out: Verified patient identification, verified procedure, site/side was marked, verified correct patient position, special equipment/implants available, medications/allergies/relevent history reviewed, required imaging and test results available.  Performed.  The area was cleaned with iodine and alcohol swabs.    The left hamstring was targeted for Extracorporeal shockwave therapy.   Preset: trochanteric bursitis  Power Level: 110 Frequency: 10 Impulse/cycles: 3200 Head size: large  Session: 2nd  Patient did tolerate procedure well.     ASSESSMENT & PLAN:   Hamstring tendinitis of left thigh Completed shockwave therapy

## 2021-10-27 ENCOUNTER — Ambulatory Visit (INDEPENDENT_AMBULATORY_CARE_PROVIDER_SITE_OTHER): Payer: Self-pay | Admitting: Family Medicine

## 2021-10-27 ENCOUNTER — Encounter: Payer: Self-pay | Admitting: Family Medicine

## 2021-10-27 DIAGNOSIS — M76892 Other specified enthesopathies of left lower limb, excluding foot: Secondary | ICD-10-CM

## 2021-10-27 NOTE — Progress Notes (Signed)
°  Sonya Munoz - 56 y.o. female MRN [id number]  Date of birth: [birth year]  SUBJECTIVE:  Including CC & ROS.  No chief complaint on file.   Sonya Munoz is a 56 y.o. female that is  here for shockwave therapy.   Review of Systems See HPI   HISTORY: Past Medical, Surgical, Social, and Family History Reviewed & Updated per EMR.   Pertinent Historical Findings include:  Past Medical History:  Diagnosis Date   Anxiety and depression    GERD (gastroesophageal reflux disease)    Insomnia     Past Surgical History:  Procedure Laterality Date   AUGMENTATION MAMMAPLASTY Bilateral 1998   saline    BREAST SURGERY Bilateral    augmentation   fallopian tube repair     RHINOPLASTY     SHOULDER OPEN ROTATOR CUFF REPAIR Left     History reviewed. No pertinent family history.  Social History   Socioeconomic History   Marital status: Divorced    Spouse name: Not on file   Number of children: Not on file   Years of education: Not on file   Highest education level: Not on file  Occupational History   Not on file  Tobacco Use   Smoking status: Never   Smokeless tobacco: Never  Substance and Sexual Activity   Alcohol use: Yes   Drug use: Not on file   Sexual activity: Not on file  Other Topics Concern   Not on file  Social History Narrative   Not on file   Social Determinants of Health   Financial Resource Strain: Not on file  Food Insecurity: Not on file  Transportation Needs: Not on file  Physical Activity: Not on file  Stress: Not on file  Social Connections: Not on file  Intimate Partner Violence: Not on file     PHYSICAL EXAM:  VS: Ht 5\' 7"  (1.702 m)    Wt 143 lb (64.9 kg)    BMI 22.40 kg/m  Physical Exam Gen: NAD, alert, cooperative with exam, well-appearing    ASSESSMENT & PLAN:   Hamstring tendinitis of left thigh Completed shockwave therapy.

## 2021-10-27 NOTE — Assessment & Plan Note (Signed)
Completed shockwave therapy  

## 2021-11-02 ENCOUNTER — Ambulatory Visit: Payer: Self-pay | Admitting: Family Medicine

## 2021-11-02 ENCOUNTER — Encounter: Payer: Self-pay | Admitting: Family Medicine

## 2021-11-02 DIAGNOSIS — M76892 Other specified enthesopathies of left lower limb, excluding foot: Secondary | ICD-10-CM

## 2021-11-02 NOTE — Assessment & Plan Note (Signed)
Completed shockwave therapy  

## 2021-11-02 NOTE — Progress Notes (Signed)
°  Sonya Munoz - 56 y.o. female MRN [id number]  Date of birth: 11/18/1965  SUBJECTIVE:  Including CC & ROS.  No chief complaint on file.   Media Sonya Munoz is a 56 y.o. female that is here for shockwave therapy.   Review of Systems See HPI   HISTORY: Past Medical, Surgical, Social, and Family History Reviewed & Updated per EMR.   Pertinent Historical Findings include:  Past Medical History:  Diagnosis Date   Anxiety and depression    GERD (gastroesophageal reflux disease)    Insomnia     Past Surgical History:  Procedure Laterality Date   AUGMENTATION MAMMAPLASTY Bilateral 1998   saline    BREAST SURGERY Bilateral    augmentation   fallopian tube repair     RHINOPLASTY     SHOULDER OPEN ROTATOR CUFF REPAIR Left      PHYSICAL EXAM:  VS: Ht 5\' 7"  (1.702 m)    Wt 143 lb (64.9 kg)    BMI 22.40 kg/m  Physical Exam Gen: NAD, alert, cooperative with exam, well-appearing MSK: Neurovascularly intact    ECSWT Note Sonya Munoz Dec 29, 1965  Procedure: ECSWT Indications: left hamstring pain   Procedure Details Consent: Risks of procedure as well as the alternatives and risks of each were explained to the (patient/caregiver).  Consent for procedure obtained. Time Out: Verified patient identification, verified procedure, site/side was marked, verified correct patient position, special equipment/implants available, medications/allergies/relevent history reviewed, required imaging and test results available.  Performed.  The area was cleaned with iodine and alcohol swabs.    The left hamstring was targeted for Extracorporeal shockwave therapy.   Preset: trochanteric bursitis  Power Level: 130 Frequency: 10 Impulse/cycles: 3900 Head size: large   Session: 4th  Patient did tolerate procedure well.    ASSESSMENT & PLAN:   Hamstring tendinitis of left thigh Completed shockwave therapy.

## 2021-11-11 ENCOUNTER — Encounter: Payer: Self-pay | Admitting: Family Medicine

## 2021-11-11 ENCOUNTER — Ambulatory Visit: Payer: Self-pay | Admitting: Family Medicine

## 2021-11-11 DIAGNOSIS — M76892 Other specified enthesopathies of left lower limb, excluding foot: Secondary | ICD-10-CM

## 2021-11-11 NOTE — Assessment & Plan Note (Signed)
Completed shockwave therapy  

## 2021-11-11 NOTE — Progress Notes (Signed)
°  Jhanvi Adaleah Forget - 56 y.o. female MRN [id number]  Date of birth: January 06, 1966  SUBJECTIVE:  Including CC & ROS.  No chief complaint on file.   Krisha Kathlyne Loud is a 56 y.o. female that is here for shockwave therapy.    Review of Systems See HPI   HISTORY: Past Medical, Surgical, Social, and Family History Reviewed & Updated per EMR.   Pertinent Historical Findings include:  Past Medical History:  Diagnosis Date   Anxiety and depression    GERD (gastroesophageal reflux disease)    Insomnia     Past Surgical History:  Procedure Laterality Date   AUGMENTATION MAMMAPLASTY Bilateral 1998   saline    BREAST SURGERY Bilateral    augmentation   fallopian tube repair     RHINOPLASTY     SHOULDER OPEN ROTATOR CUFF REPAIR Left      PHYSICAL EXAM:  VS: Ht 5\' 7"  (1.702 m)    Wt 143 lb (64.9 kg)    BMI 22.40 kg/m  Physical Exam Gen: NAD, alert, cooperative with exam, well-appearing MSK:  Neurovascularly intact    ECSWT Note Ayahna Solazzo 02/09/1966  Procedure: ECSWT Indications: left hamstring pain  Procedure Details Consent: Risks of procedure as well as the alternatives and risks of each were explained to the (patient/caregiver).  Consent for procedure obtained. Time Out: Verified patient identification, verified procedure, site/side was marked, verified correct patient position, special equipment/implants available, medications/allergies/relevent history reviewed, required imaging and test results available.  Performed.  The area was cleaned with iodine and alcohol swabs.    The left hamstring was targeted for Extracorporeal shockwave therapy.   Preset: Trochanteric bursitis Power Level: 140 Frequency: 10 Impulse/cycles: 4200 Head size: Large Session: 5th  Patient did tolerate procedure well.    ASSESSMENT & PLAN:   Hamstring tendinitis of left thigh Completed shockwave therapy.

## 2021-11-15 ENCOUNTER — Ambulatory Visit: Payer: Self-pay | Admitting: Family Medicine

## 2021-11-15 ENCOUNTER — Encounter: Payer: Self-pay | Admitting: Family Medicine

## 2021-11-15 DIAGNOSIS — M76892 Other specified enthesopathies of left lower limb, excluding foot: Secondary | ICD-10-CM

## 2021-11-15 NOTE — Assessment & Plan Note (Signed)
Completed shockwave therapy  

## 2021-11-15 NOTE — Progress Notes (Signed)
°  Sonya Munoz - 56 y.o. female MRN [id number]  Date of birth: 1966-03-19  SUBJECTIVE:  Including CC & ROS.  No chief complaint on file.   Sonya Munoz is a 56 y.o. female that is here for shockwave therapy.    Review of Systems See HPI   HISTORY: Past Medical, Surgical, Social, and Family History Reviewed & Updated per EMR.   Pertinent Historical Findings include:  Past Medical History:  Diagnosis Date   Anxiety and depression    GERD (gastroesophageal reflux disease)    Insomnia     Past Surgical History:  Procedure Laterality Date   AUGMENTATION MAMMAPLASTY Bilateral 1998   saline    BREAST SURGERY Bilateral    augmentation   fallopian tube repair     RHINOPLASTY     SHOULDER OPEN ROTATOR CUFF REPAIR Left      PHYSICAL EXAM:  VS: Ht 5\' 7"  (1.702 m)    Wt 143 lb (64.9 kg)    BMI 22.40 kg/m  Physical Exam Gen: NAD, alert, cooperative with exam, well-appearing MSK:  Neurovascularly intact    ECSWT Note Sonya Munoz Jul 01, 1966  Procedure: ECSWT Indications: left hamstring pain   Procedure Details Consent: Risks of procedure as well as the alternatives and risks of each were explained to the (patient/caregiver).  Consent for procedure obtained. Time Out: Verified patient identification, verified procedure, site/side was marked, verified correct patient position, special equipment/implants available, medications/allergies/relevent history reviewed, required imaging and test results available.  Performed.  The area was cleaned with iodine and alcohol swabs.    The left hamstring was targeted for Extracorporeal shockwave therapy.   Preset: Trochanteric bursitis Power Level: 150 Frequency: 10 Impulse/cycles: 4600 Head size: Large Session: 6th  Patient did tolerate procedure well.    ASSESSMENT & PLAN:   Hamstring tendinitis of left thigh Completed shockwave therapy

## 2021-11-28 ENCOUNTER — Ambulatory Visit (INDEPENDENT_AMBULATORY_CARE_PROVIDER_SITE_OTHER): Payer: 59 | Admitting: Family Medicine

## 2021-11-28 VITALS — BP 127/77 | Ht 67.0 in | Wt 137.0 lb

## 2021-11-28 DIAGNOSIS — M76892 Other specified enthesopathies of left lower limb, excluding foot: Secondary | ICD-10-CM

## 2021-11-28 DIAGNOSIS — M222X1 Patellofemoral disorders, right knee: Secondary | ICD-10-CM | POA: Diagnosis not present

## 2021-11-28 NOTE — Assessment & Plan Note (Signed)
Completed shockwave therapy  

## 2021-11-28 NOTE — Progress Notes (Signed)
°  Sonya Munoz - 56 y.o. female MRN [id number]  Date of birth: 09-28-1966  SUBJECTIVE:  Including CC & ROS.  No chief complaint on file.   Sonya Munoz is a 56 y.o. female that is presenting with acute on chronic right knee pain.  The pain is intermittent and anterior nature.  No inciting event.  No swelling.   Review of Systems See HPI   HISTORY: Past Medical, Surgical, Social, and Family History Reviewed & Updated per EMR.   Pertinent Historical Findings include:  Past Medical History:  Diagnosis Date   Anxiety and depression    GERD (gastroesophageal reflux disease)    Insomnia     Past Surgical History:  Procedure Laterality Date   AUGMENTATION MAMMAPLASTY Bilateral 1998   saline    BREAST SURGERY Bilateral    augmentation   fallopian tube repair     RHINOPLASTY     SHOULDER OPEN ROTATOR CUFF REPAIR Left      PHYSICAL EXAM:  VS: BP 127/77    Ht 5\' 7"  (1.702 m)    Wt 137 lb (62.1 kg)    BMI 21.46 kg/m  Physical Exam Gen: NAD, alert, cooperative with exam, well-appearing MSK:  Neurovascularly intact    ECSWT Note Aniqa Hare Dec 28, 1965  Procedure: ECSWT Indications: left hamstring pain  Procedure Details Consent: Risks of procedure as well as the alternatives and risks of each were explained to the (patient/caregiver).  Consent for procedure obtained. Time Out: Verified patient identification, verified procedure, site/side was marked, verified correct patient position, special equipment/implants available, medications/allergies/relevent history reviewed, required imaging and test results available.  Performed.  The area was cleaned with iodine and alcohol swabs.    The left hamstring  was targeted for Extracorporeal shockwave therapy.   Preset: Trochanteric bursitis Power Level: 160 Frequency: 10 Impulse/cycles: 5000 Head size: large  Session: 7th  Patient did tolerate procedure well.    ASSESSMENT & PLAN:   Hamstring tendinitis  of left thigh Completed shockwave therapy.  Patellofemoral pain syndrome of right knee Acutely occurring.  Intermittent in nature.  Seems more associated with the pronation she experiences in her midfoot.  Occurs mainly after squatting. -Counseled on home exercise therapy and supportive care. -Green sport insoles. -Could consider scaphoid baths or custom orthotics.

## 2021-11-28 NOTE — Assessment & Plan Note (Signed)
Acutely occurring.  Intermittent in nature.  Seems more associated with the pronation she experiences in her midfoot.  Occurs mainly after squatting. -Counseled on home exercise therapy and supportive care. -Green sport insoles. -Could consider scaphoid baths or custom orthotics.

## 2021-11-29 ENCOUNTER — Ambulatory Visit: Payer: 59 | Admitting: Family Medicine

## 2021-11-29 ENCOUNTER — Other Ambulatory Visit (HOSPITAL_COMMUNITY): Payer: Self-pay

## 2021-12-12 ENCOUNTER — Ambulatory Visit: Payer: Self-pay | Admitting: Family Medicine

## 2021-12-12 DIAGNOSIS — M76892 Other specified enthesopathies of left lower limb, excluding foot: Secondary | ICD-10-CM

## 2021-12-12 NOTE — Assessment & Plan Note (Signed)
Completed shockwave therapy  

## 2021-12-12 NOTE — Progress Notes (Signed)
°  Sonya Munoz - 56 y.o. female MRN [id number]  Date of birth: 10/02/1966  SUBJECTIVE:  Including CC & ROS.  No chief complaint on file.   Sonya Munoz is a 56 y.o. female that is here for shockwave therapy.   Review of Systems See HPI   HISTORY: Past Medical, Surgical, Social, and Family History Reviewed & Updated per EMR.   Pertinent Historical Findings include:  Past Medical History:  Diagnosis Date   Anxiety and depression    GERD (gastroesophageal reflux disease)    Insomnia     Past Surgical History:  Procedure Laterality Date   AUGMENTATION MAMMAPLASTY Bilateral 1998   saline    BREAST SURGERY Bilateral    augmentation   fallopian tube repair     RHINOPLASTY     SHOULDER OPEN ROTATOR CUFF REPAIR Left      PHYSICAL EXAM:  VS: Ht 5\' 7"  (1.702 m)    BMI 21.46 kg/m  Physical Exam Gen: NAD, alert, cooperative with exam, well-appearing MSK:  Neurovascularly intact    ECSWT Note Tamsin Nader 1966-07-08  Procedure: ECSWT Indications: left hamstring pain   Procedure Details Consent: Risks of procedure as well as the alternatives and risks of each were explained to the (patient/caregiver).  Consent for procedure obtained. Time Out: Verified patient identification, verified procedure, site/side was marked, verified correct patient position, special equipment/implants available, medications/allergies/relevent history reviewed, required imaging and test results available.  Performed.  The area was cleaned with iodine and alcohol swabs.    The left hamstring was targeted for Extracorporeal shockwave therapy.   Preset: trochanteric bursitis Power Level: 150 Frequency: 10 Impulse/cycles: 5000 Head size: large  Session: 8th  Patient did tolerate procedure well.    ASSESSMENT & PLAN:   Hamstring tendinitis of left thigh Completed shockwave therapy

## 2022-01-16 ENCOUNTER — Other Ambulatory Visit (HOSPITAL_COMMUNITY): Payer: Self-pay

## 2022-01-16 MED ORDER — SERTRALINE HCL 50 MG PO TABS
ORAL_TABLET | ORAL | 1 refills | Status: DC
  Filled 2022-01-16: qty 90, 90d supply, fill #0
  Filled 2022-04-16: qty 90, 90d supply, fill #1

## 2022-01-18 ENCOUNTER — Other Ambulatory Visit (HOSPITAL_COMMUNITY): Payer: Self-pay

## 2022-01-18 MED ORDER — FLUOCINONIDE 0.05 % EX SOLN
CUTANEOUS | 1 refills | Status: DC
  Filled 2022-01-18: qty 60, 30d supply, fill #0

## 2022-02-08 ENCOUNTER — Ambulatory Visit: Payer: Self-pay | Admitting: Family Medicine

## 2022-02-08 ENCOUNTER — Encounter: Payer: Self-pay | Admitting: Family Medicine

## 2022-02-08 DIAGNOSIS — M76892 Other specified enthesopathies of left lower limb, excluding foot: Secondary | ICD-10-CM

## 2022-02-08 NOTE — Progress Notes (Signed)
?  Sonya Munoz - 56 y.o. female MRN [id number]  Date of birth: 16-Jan-1966 ? ?SUBJECTIVE:  Including CC & ROS.  ?No chief complaint on file. ? ? ?Sonya Munoz is a 56 y.o. female that is  here for shockwave therapy. ? ? ? ?Review of Systems ?See HPI  ? ?HISTORY: Past Medical, Surgical, Social, and Family History Reviewed & Updated per EMR.   ?Pertinent Historical Findings include: ? ?Past Medical History:  ?Diagnosis Date  ? Anxiety and depression   ? GERD (gastroesophageal reflux disease)   ? Insomnia   ? ? ?Past Surgical History:  ?Procedure Laterality Date  ? AUGMENTATION MAMMAPLASTY Bilateral 1998  ? saline   ? BREAST SURGERY Bilateral   ? augmentation  ? fallopian tube repair    ? RHINOPLASTY    ? SHOULDER OPEN ROTATOR CUFF REPAIR Left   ? ? ? ?PHYSICAL EXAM:  ?VS: Ht '5\' 7"'$  (1.702 m)   Wt 137 lb (62.1 kg)   BMI 21.46 kg/m?  ?Physical Exam ?Gen: NAD, alert, cooperative with exam, well-appearing ?MSK:  ?Neurovascularly intact   ? ?ECSWT Note ?Sonya Munoz ?05-10-1966 ? ?Procedure: ECSWT ?Indications: left hamstring pain ? ?Procedure Details ?Consent: Risks of procedure as well as the alternatives and risks of each were explained to the (patient/caregiver).  Consent for procedure obtained. ?Time Out: Verified patient identification, verified procedure, site/side was marked, verified correct patient position, special equipment/implants available, medications/allergies/relevent history reviewed, required imaging and test results available.  Performed.  The area was cleaned with iodine and alcohol swabs.   ? ?The left hamstring was targeted for Extracorporeal shockwave therapy.  ? ?Preset: trochanteric bursitis  ?Power Level: 100 ?Frequency: 10 ?Impulse/cycles: 3400 ?Head size: large  ?Session: 9th ? ?Patient did tolerate procedure well. ? ? ? ?ASSESSMENT & PLAN:  ? ?Hamstring tendinitis of left thigh ?Completed shockwave therapy.  ? ? ? ? ?

## 2022-02-08 NOTE — Assessment & Plan Note (Signed)
Completed shockwave therapy  

## 2022-02-15 ENCOUNTER — Ambulatory Visit: Payer: Self-pay | Admitting: Family Medicine

## 2022-02-15 ENCOUNTER — Encounter: Payer: Self-pay | Admitting: Family Medicine

## 2022-02-15 DIAGNOSIS — M76892 Other specified enthesopathies of left lower limb, excluding foot: Secondary | ICD-10-CM

## 2022-02-15 NOTE — Progress Notes (Signed)
?  Sonya Munoz - 56 y.o. female MRN [id number]  Date of birth: 1966/09/14 ? ?SUBJECTIVE:  Including CC & ROS.  ?No chief complaint on file. ? ? ?Sonya Munoz is a 56 y.o. female that is here for shockwave therapy. ? ? ?Review of Systems ?See HPI  ? ?HISTORY: Past Medical, Surgical, Social, and Family History Reviewed & Updated per EMR.   ?Pertinent Historical Findings include: ? ?Past Medical History:  ?Diagnosis Date  ? Anxiety and depression   ? GERD (gastroesophageal reflux disease)   ? Insomnia   ? ? ?Past Surgical History:  ?Procedure Laterality Date  ? AUGMENTATION MAMMAPLASTY Bilateral 1998  ? saline   ? BREAST SURGERY Bilateral   ? augmentation  ? fallopian tube repair    ? RHINOPLASTY    ? SHOULDER OPEN ROTATOR CUFF REPAIR Left   ? ? ? ?PHYSICAL EXAM:  ?VS: Ht '5\' 7"'$  (1.702 m)   Wt 137 lb (62.1 kg)   BMI 21.46 kg/m?  ?Physical Exam ?Gen: NAD, alert, cooperative with exam, well-appearing ?MSK:  ?Neurovascularly intact   ? ?ECSWT Note ?Sonya Munoz ?09/06/1966 ? ?Procedure: ECSWT ?Indications: left hamstring pain  ? ?Procedure Details ?Consent: Risks of procedure as well as the alternatives and risks of each were explained to the (patient/caregiver).  Consent for procedure obtained. ?Time Out: Verified patient identification, verified procedure, site/side was marked, verified correct patient position, special equipment/implants available, medications/allergies/relevent history reviewed, required imaging and test results available.  Performed.  The area was cleaned with iodine and alcohol swabs.   ? ?The left hamstring origin was targeted for Extracorporeal shockwave therapy.  ? ?Preset: Trochanteric bursitis ?Power Level: 100 ?Frequency: 10 ?Impulse/cycles: 3300 ?Head size: Large ?Session: 10 ? ?Patient did tolerate procedure well. ? ? ? ?ASSESSMENT & PLAN:  ? ?Hamstring tendinitis of left thigh ?Completed shockwave therapy.  ? ? ? ? ?

## 2022-02-15 NOTE — Assessment & Plan Note (Signed)
Completed shockwave therapy  

## 2022-02-24 ENCOUNTER — Encounter: Payer: Self-pay | Admitting: Family Medicine

## 2022-02-24 ENCOUNTER — Ambulatory Visit: Payer: Self-pay | Admitting: Family Medicine

## 2022-02-24 DIAGNOSIS — M76892 Other specified enthesopathies of left lower limb, excluding foot: Secondary | ICD-10-CM

## 2022-02-24 NOTE — Progress Notes (Signed)
?  Sonya Munoz - 56 y.o. female MRN [id number]  Date of birth: 13-Aug-1966 ? ?SUBJECTIVE:  Including CC & ROS.  ?No chief complaint on file. ? ? ?Sonya Munoz is a 56 y.o. female that is  here for shockwave therapy. ? ? ? ?Review of Systems ?See HPI  ? ?HISTORY: Past Medical, Surgical, Social, and Family History Reviewed & Updated per EMR.   ?Pertinent Historical Findings include: ? ?Past Medical History:  ?Diagnosis Date  ? Anxiety and depression   ? GERD (gastroesophageal reflux disease)   ? Insomnia   ? ? ?Past Surgical History:  ?Procedure Laterality Date  ? AUGMENTATION MAMMAPLASTY Bilateral 1998  ? saline   ? BREAST SURGERY Bilateral   ? augmentation  ? fallopian tube repair    ? RHINOPLASTY    ? SHOULDER OPEN ROTATOR CUFF REPAIR Left   ? ? ? ?PHYSICAL EXAM:  ?VS: Ht '5\' 7"'$  (1.702 m)   Wt 137 lb (62.1 kg)   BMI 21.46 kg/m?  ?Physical Exam ?Gen: NAD, alert, cooperative with exam, well-appearing ?MSK:  ?Neurovascularly intact   ? ?ECSWT Note ?Sonya Munoz ?1966-10-03 ? ?Procedure: ECSWT ?Indications: left hamstring pain  ? ?Procedure Details ?Consent: Risks of procedure as well as the alternatives and risks of each were explained to the (patient/caregiver).  Consent for procedure obtained. ?Time Out: Verified patient identification, verified procedure, site/side was marked, verified correct patient position, special equipment/implants available, medications/allergies/relevent history reviewed, required imaging and test results available.  Performed.  The area was cleaned with iodine and alcohol swabs.   ? ?The left hamstring was targeted for Extracorporeal shockwave therapy.  ? ?Preset: trochanteric bursitis ?Power Level: 110 ?Frequency: 10 ?Impulse/cycles: 3400 ?Head size: large  ?Session: 11 ? ?Patient did tolerate procedure well. ? ? ? ?ASSESSMENT & PLAN:  ? ?Hamstring tendinitis of left thigh ?Completed shockwave therapy  ? ? ? ? ?

## 2022-02-24 NOTE — Assessment & Plan Note (Signed)
Completed shockwave therapy  

## 2022-02-28 ENCOUNTER — Other Ambulatory Visit (HOSPITAL_COMMUNITY): Payer: Self-pay

## 2022-02-28 MED ORDER — TRAZODONE HCL 50 MG PO TABS
ORAL_TABLET | ORAL | 0 refills | Status: DC
Start: 2022-02-28 — End: 2023-04-17
  Filled 2022-02-28: qty 90, 90d supply, fill #0

## 2022-02-28 MED ORDER — LISINOPRIL 10 MG PO TABS
10.0000 mg | ORAL_TABLET | Freq: Every day | ORAL | 0 refills | Status: DC
  Filled 2022-02-28: qty 90, 90d supply, fill #0

## 2022-03-01 ENCOUNTER — Ambulatory Visit: Payer: Self-pay | Admitting: Family Medicine

## 2022-03-01 ENCOUNTER — Encounter: Payer: Self-pay | Admitting: Family Medicine

## 2022-03-01 DIAGNOSIS — M76892 Other specified enthesopathies of left lower limb, excluding foot: Secondary | ICD-10-CM

## 2022-03-01 NOTE — Progress Notes (Signed)
?  Sonya Munoz - 56 y.o. female MRN [id number]  Date of birth: December 03, 1965 ? ?SUBJECTIVE:  Including CC & ROS.  ?No chief complaint on file. ? ? ?Sonya Munoz is a 56 y.o. female that is here for shockwave therapy. ? ? ?Review of Systems ?See HPI  ? ?HISTORY: Past Medical, Surgical, Social, and Family History Reviewed & Updated per EMR.   ?Pertinent Historical Findings include: ? ?Past Medical History:  ?Diagnosis Date  ? Anxiety and depression   ? GERD (gastroesophageal reflux disease)   ? Insomnia   ? ? ?Past Surgical History:  ?Procedure Laterality Date  ? AUGMENTATION MAMMAPLASTY Bilateral 1998  ? saline   ? BREAST SURGERY Bilateral   ? augmentation  ? fallopian tube repair    ? RHINOPLASTY    ? SHOULDER OPEN ROTATOR CUFF REPAIR Left   ? ? ? ?PHYSICAL EXAM:  ?VS: Ht '5\' 7"'$  (1.702 m)   Wt 137 lb (62.1 kg)   BMI 21.46 kg/m?  ?Physical Exam ?Gen: NAD, alert, cooperative with exam, well-appearing ?MSK:  ?Neurovascularly intact   ? ?ECSWT Note ?Sonya Munoz ?December 21, 1965 ? ?Procedure: ECSWT ?Indications: Left hamstring pain ? ?Procedure Details ?Consent: Risks of procedure as well as the alternatives and risks of each were explained to the (patient/caregiver).  Consent for procedure obtained. ?Time Out: Verified patient identification, verified procedure, site/side was marked, verified correct patient position, special equipment/implants available, medications/allergies/relevent history reviewed, required imaging and test results available.  Performed.  The area was cleaned with iodine and alcohol swabs.   ? ?The left hamstring was targeted for Extracorporeal shockwave therapy.  ? ?Preset: Trochanteric bursitis ?Power Level: 120 ?Frequency: 10 ?Impulse/cycles: 3500 ?Head size: Large ?Session: 12 ? ?Patient did tolerate procedure well. ? ? ? ?ASSESSMENT & PLAN:  ? ?Hamstring tendinitis of left thigh ?Completed shockwave therapy ? ? ? ? ?

## 2022-03-01 NOTE — Assessment & Plan Note (Signed)
Completed shockwave therapy  

## 2022-03-03 DIAGNOSIS — G4709 Other insomnia: Secondary | ICD-10-CM | POA: Diagnosis not present

## 2022-03-03 DIAGNOSIS — N951 Menopausal and female climacteric states: Secondary | ICD-10-CM | POA: Diagnosis not present

## 2022-03-03 DIAGNOSIS — Z0000 Encounter for general adult medical examination without abnormal findings: Secondary | ICD-10-CM | POA: Diagnosis not present

## 2022-03-03 DIAGNOSIS — E7800 Pure hypercholesterolemia, unspecified: Secondary | ICD-10-CM | POA: Diagnosis not present

## 2022-03-03 DIAGNOSIS — F418 Other specified anxiety disorders: Secondary | ICD-10-CM | POA: Diagnosis not present

## 2022-03-03 DIAGNOSIS — I10 Essential (primary) hypertension: Secondary | ICD-10-CM | POA: Diagnosis not present

## 2022-03-10 ENCOUNTER — Encounter: Payer: Self-pay | Admitting: Family Medicine

## 2022-03-10 ENCOUNTER — Ambulatory Visit: Payer: Self-pay | Admitting: Family Medicine

## 2022-03-10 DIAGNOSIS — M76892 Other specified enthesopathies of left lower limb, excluding foot: Secondary | ICD-10-CM

## 2022-03-10 NOTE — Progress Notes (Signed)
  Sonya Munoz - 56 y.o. female MRN [id number]  Date of birth: 1966/02/04  SUBJECTIVE:  Including CC & ROS.  No chief complaint on file.   Sonya Munoz is a 56 y.o. female that is here for shockwave therapy.   Review of Systems See HPI   HISTORY: Past Medical, Surgical, Social, and Family History Reviewed & Updated per EMR.   Pertinent Historical Findings include:  Past Medical History:  Diagnosis Date   Anxiety and depression    GERD (gastroesophageal reflux disease)    Insomnia     Past Surgical History:  Procedure Laterality Date   AUGMENTATION MAMMAPLASTY Bilateral 1998   saline    BREAST SURGERY Bilateral    augmentation   fallopian tube repair     RHINOPLASTY     SHOULDER OPEN ROTATOR CUFF REPAIR Left      PHYSICAL EXAM:  VS: There were no vitals taken for this visit. Physical Exam Gen: NAD, alert, cooperative with exam, well-appearing MSK:  Neurovascularly intact    ECSWT Note Sonya Munoz Mar 30, 1966  Procedure: ECSWT Indications: left hamstring pain   Procedure Details Consent: Risks of procedure as well as the alternatives and risks of each were explained to the (patient/caregiver).  Consent for procedure obtained. Time Out: Verified patient identification, verified procedure, site/side was marked, verified correct patient position, special equipment/implants available, medications/allergies/relevent history reviewed, required imaging and test results available.  Performed.  The area was cleaned with iodine and alcohol swabs.    The left hamstring was targeted for Extracorporeal shockwave therapy.   Preset: Trochanteric bursitis Power Level: 130 Frequency: 10 Impulse/cycles: 300 Head size: Large Session: 13  Patient did tolerate procedure well.    ASSESSMENT & PLAN:   Hamstring tendinitis of left thigh Completed shockwave therapy

## 2022-03-10 NOTE — Assessment & Plan Note (Signed)
Completed shockwave therapy  

## 2022-03-28 ENCOUNTER — Ambulatory Visit (INDEPENDENT_AMBULATORY_CARE_PROVIDER_SITE_OTHER): Payer: Self-pay | Admitting: Family Medicine

## 2022-03-28 ENCOUNTER — Encounter: Payer: Self-pay | Admitting: Family Medicine

## 2022-03-28 DIAGNOSIS — M76892 Other specified enthesopathies of left lower limb, excluding foot: Secondary | ICD-10-CM

## 2022-03-28 NOTE — Progress Notes (Signed)
  Sonya Munoz - 56 y.o. female MRN [id number]  Date of birth: 05-16-1966  SUBJECTIVE:  Including CC & ROS.  No chief complaint on file.   Sonya Munoz is a 56 y.o. female that is  here for shockwave therapy.    Review of Systems See HPI   HISTORY: Past Medical, Surgical, Social, and Family History Reviewed & Updated per EMR.   Pertinent Historical Findings include:  Past Medical History:  Diagnosis Date   Anxiety and depression    GERD (gastroesophageal reflux disease)    Insomnia     Past Surgical History:  Procedure Laterality Date   AUGMENTATION MAMMAPLASTY Bilateral 1998   saline    BREAST SURGERY Bilateral    augmentation   fallopian tube repair     RHINOPLASTY     SHOULDER OPEN ROTATOR CUFF REPAIR Left      PHYSICAL EXAM:  VS: Ht '5\' 7"'$  (1.702 m)   Wt 137 lb (62.1 kg)   BMI 21.46 kg/m  Physical Exam Gen: NAD, alert, cooperative with exam, well-appearing MSK:  Neurovascularly intact    ECSWT Note Sonya Munoz January 31, 1966  Procedure: ECSWT Indications: left hamstring pain   Procedure Details Consent: Risks of procedure as well as the alternatives and risks of each were explained to the (patient/caregiver).  Consent for procedure obtained. Time Out: Verified patient identification, verified procedure, site/side was marked, verified correct patient position, special equipment/implants available, medications/allergies/relevent history reviewed, required imaging and test results available.  Performed.  The area was cleaned with iodine and alcohol swabs.    The left hamstring was targeted for Extracorporeal shockwave therapy.   Preset: trochanteric bursitis  Power Level: 130 Frequency: 10 Impulse/cycles: 3000 Head size: large  Session: 14  Patient did tolerate procedure well.    ASSESSMENT & PLAN:   Hamstring tendinitis of left thigh Completed shockwave therapy

## 2022-03-28 NOTE — Assessment & Plan Note (Signed)
Completed shockwave therapy  

## 2022-03-30 ENCOUNTER — Encounter: Payer: Self-pay | Admitting: Family Medicine

## 2022-04-17 ENCOUNTER — Other Ambulatory Visit (HOSPITAL_COMMUNITY): Payer: Self-pay

## 2022-05-25 DIAGNOSIS — M76892 Other specified enthesopathies of left lower limb, excluding foot: Secondary | ICD-10-CM | POA: Diagnosis not present

## 2022-05-27 IMAGING — MR MR FEMUR*L* W/O CM
5 series · 39 of 40 positions shown · non-contrast
Comparison: Plain films left hip 03/17/2021.

CLINICAL DATA: Posterior left thigh/hamstring pain for several
months. Possible overuse injury working out.

EXAM:
MR OF THE LEFT FEMUR WITHOUT CONTRAST
TECHNIQUE: Multiplanar, multisequence MR imaging of the left femur was
performed. No intravenous contrast was administered.

[Series 12: T1 · coronal · non-contrast · 6.5mm · 0.78mm/px · 6 of 25 slices shown (1 of 2)]
[im 1/25]
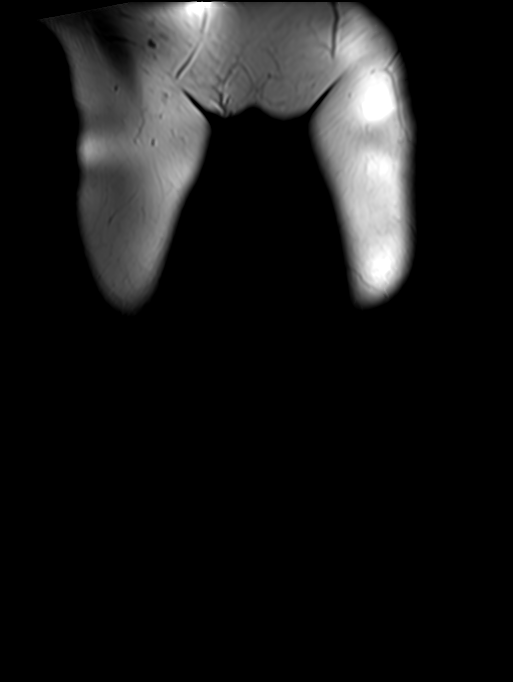
[im 5/25]
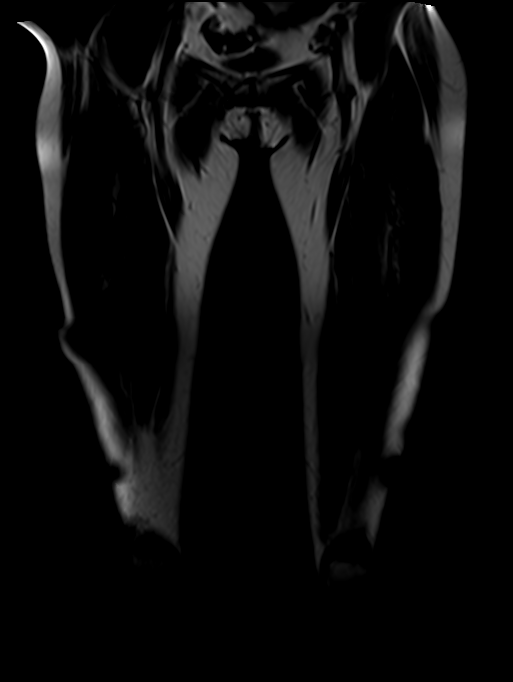
[im 10/25]
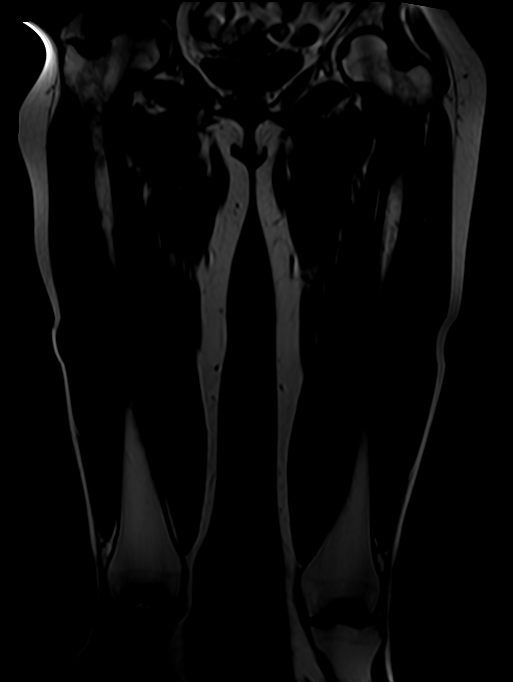
[im 15/25]
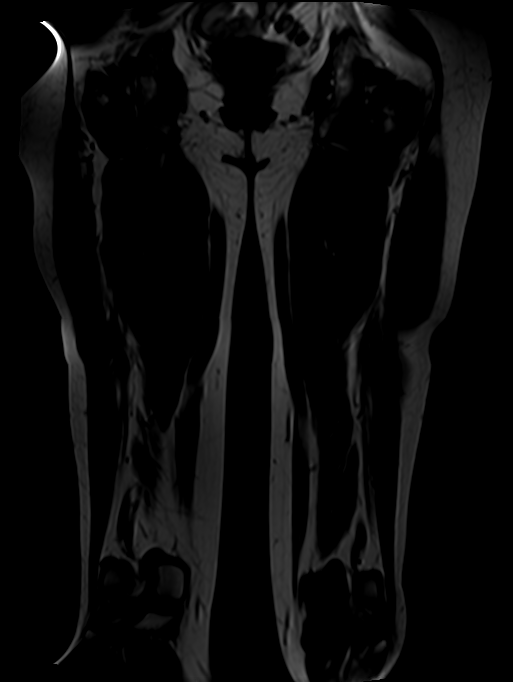
[im 20/25]
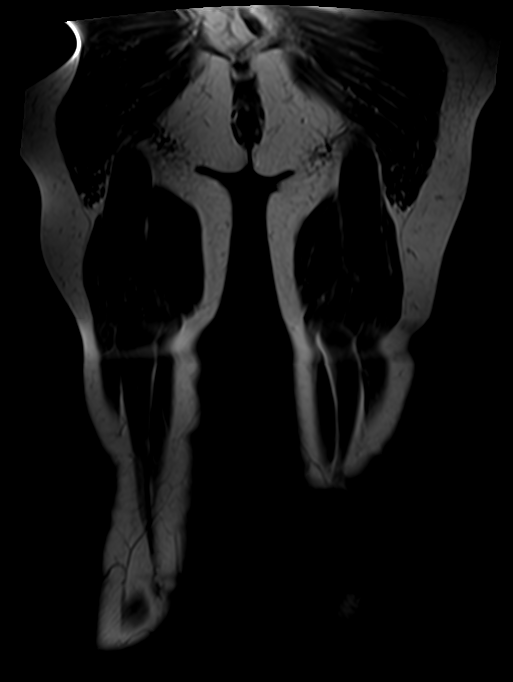
[im 25/25]
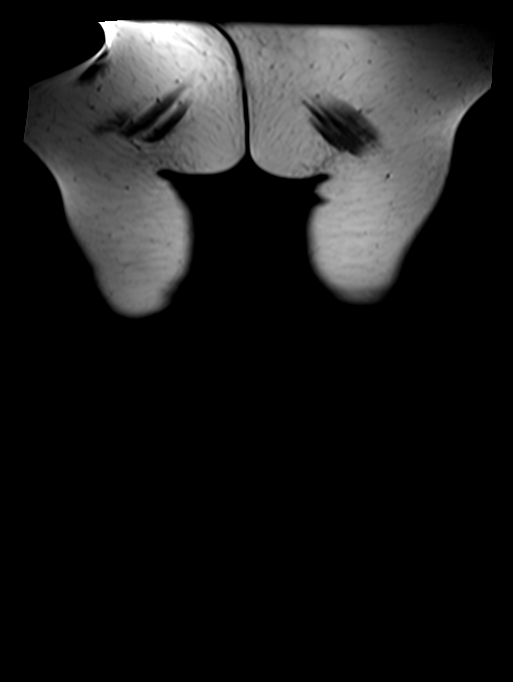

[Series 14: T2 · coronal · 6.5mm · 1.56mm/px · 6 of 25 slices shown]
[im 1/25]
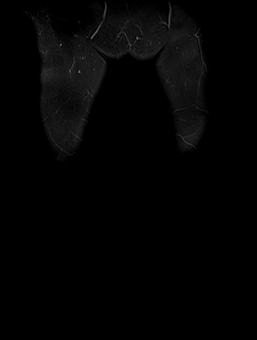
[im 5/25]
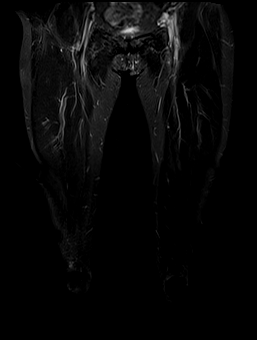
[im 10/25]
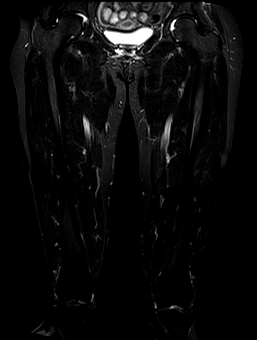
[im 15/25]
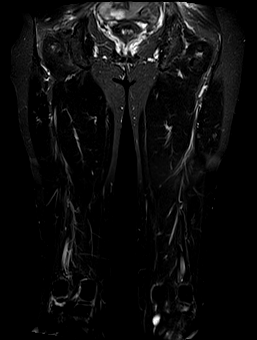
[im 20/25]
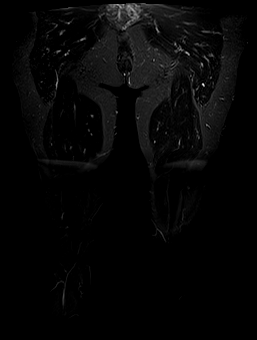
[im 25/25]
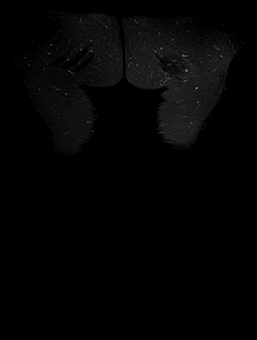

[Series 17: T1 · axial · 7.5mm · 0.78mm/px · z∈[-399,+118]mm · 11 of 54 slices shown (2 of 2)]
[im 1/54]
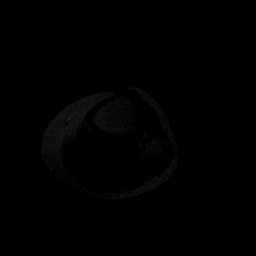
[im 6/54]
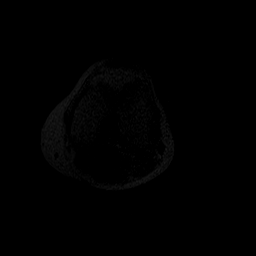
[im 11/54]
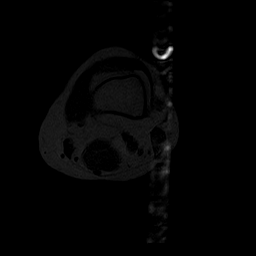
[im 16/54]
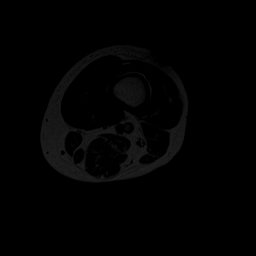
[im 22/54]
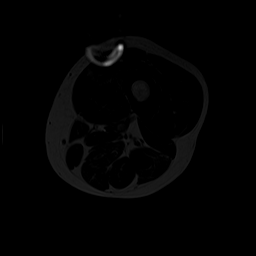
[im 27/54]
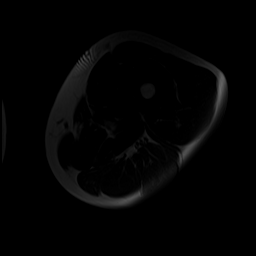
[im 32/54]
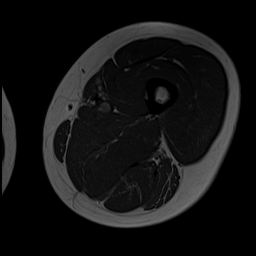
[im 38/54]
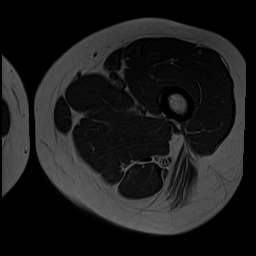
[im 43/54]
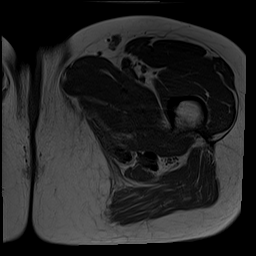
[im 48/54]
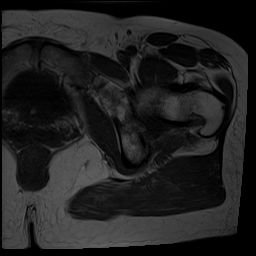
[im 54/54]
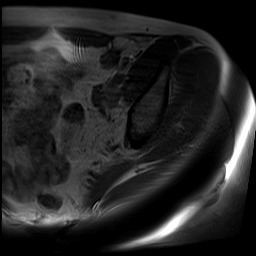

[Series 20: STIR · axial · 7.5mm · 0.78mm/px · z∈[-399,+118]mm · 11 of 54 slices shown (1 of 2)]
[im 1/54]
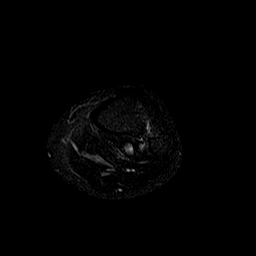
[im 6/54]
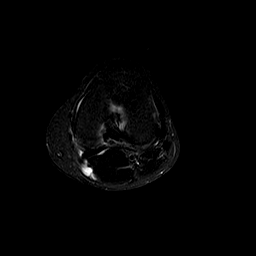
[im 11/54]
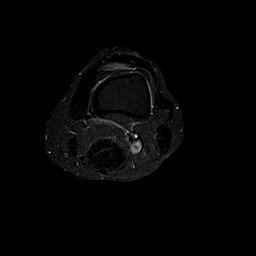
[im 16/54]
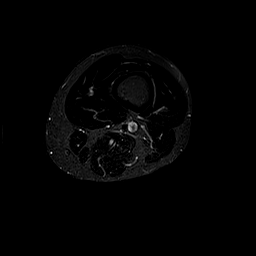
[im 22/54]
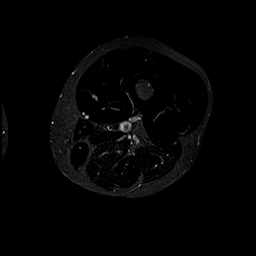
[im 27/54]
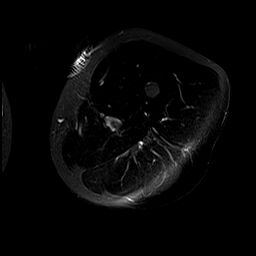
[im 32/54]
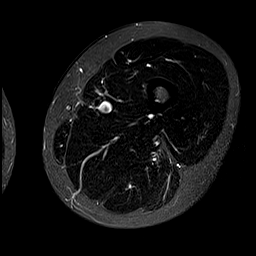
[im 38/54]
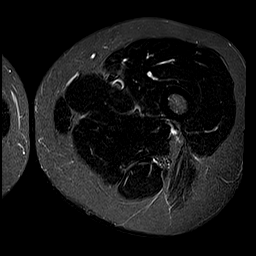
[im 43/54]
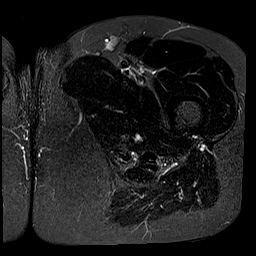
[im 48/54]
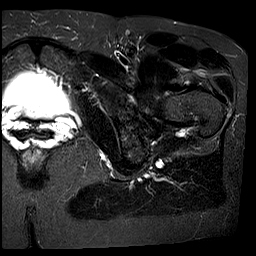
[im 54/54]
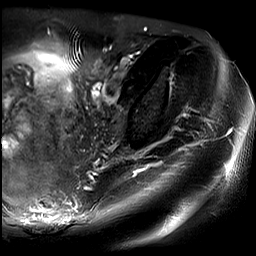

[Series 100: STIR · 5 of 30 slices shown (2 of 2)]
[im 1/30]
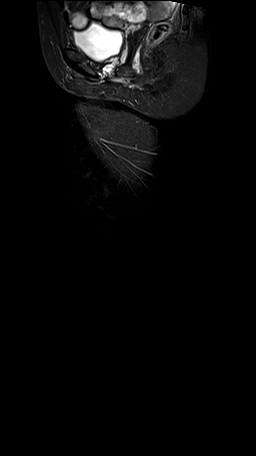
[im 6/30]
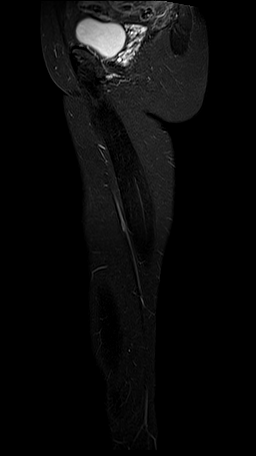
[im 12/30]
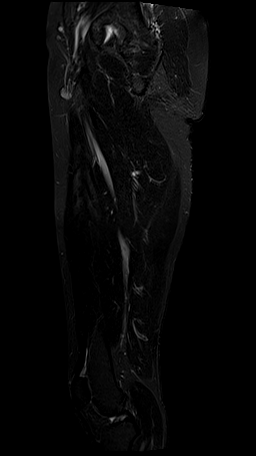
[im 18/30]
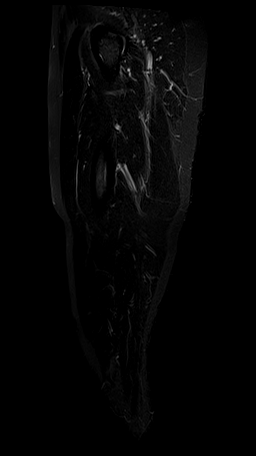
[im 24/30]
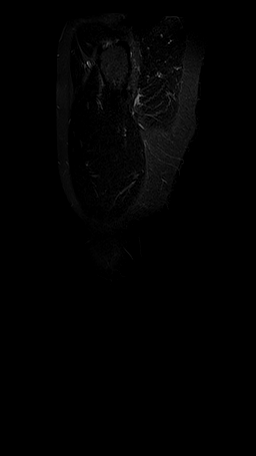

[39 of 40 positions shown; findings below may reference images not displayed]

FINDINGS: Bones/Joint/Cartilage

Marrow signal is normal throughout without fracture, stress change
or focal lesion. No subchondral cyst formation or edema about the
hip or knee. No avascular necrosis of the femoral heads. No hip or
knee effusion.

Ligaments

Normal.

Muscles and Tendons

No tear is identified. There is mild edema about the hamstrings
bilaterally which is somewhat worse on the left compatible with
tendinosis. Please note that the right hamstring origin is
visualized in the coronal plane only. No muscle atrophy or focal
lesion.

Soft tissues

Negative for mass. The patient has a Baker's cyst of the left knee
measuring approximately 2.2 cm AP x 1.2 cm transverse by 3 cm
craniocaudal.
IMPRESSION: Negative for muscle or tendon tear. Mild edema at the hamstring
origins bilaterally is greater on the left and compatible with
tendinosis or strain.

Small Baker's cyst left knee.

## 2022-05-29 ENCOUNTER — Other Ambulatory Visit (HOSPITAL_COMMUNITY): Payer: Self-pay

## 2022-05-29 MED ORDER — LISINOPRIL 10 MG PO TABS
10.0000 mg | ORAL_TABLET | Freq: Every day | ORAL | 0 refills | Status: DC
  Filled 2022-05-29: qty 90, 90d supply, fill #0

## 2022-05-30 ENCOUNTER — Other Ambulatory Visit: Payer: Self-pay | Admitting: Family Medicine

## 2022-05-30 DIAGNOSIS — Z1231 Encounter for screening mammogram for malignant neoplasm of breast: Secondary | ICD-10-CM

## 2022-06-28 ENCOUNTER — Other Ambulatory Visit: Payer: Self-pay

## 2022-06-28 ENCOUNTER — Ambulatory Visit (HOSPITAL_BASED_OUTPATIENT_CLINIC_OR_DEPARTMENT_OTHER): Payer: 59 | Attending: Family Medicine | Admitting: Physical Therapy

## 2022-06-28 ENCOUNTER — Encounter (HOSPITAL_BASED_OUTPATIENT_CLINIC_OR_DEPARTMENT_OTHER): Payer: Self-pay | Admitting: Physical Therapy

## 2022-06-28 DIAGNOSIS — M79605 Pain in left leg: Secondary | ICD-10-CM

## 2022-06-28 DIAGNOSIS — R252 Cramp and spasm: Secondary | ICD-10-CM

## 2022-06-28 DIAGNOSIS — M76892 Other specified enthesopathies of left lower limb, excluding foot: Secondary | ICD-10-CM | POA: Diagnosis not present

## 2022-06-28 NOTE — Therapy (Signed)
OUTPATIENT PHYSICAL THERAPY LOWER EXTREMITY EVALUATION   Patient Name: Sonya Munoz MRN: [id number] DOB:[birth year], 56 y.o., female Today's Date: 06/28/2022   PT End of Session - 06/28/22 0901     Visit Number 1    Number of Visits 16    Date for PT Re-Evaluation 08/09/22    PT Start Time 0845    PT Stop Time 0928    PT Time Calculation (min) 43 min    Activity Tolerance Patient tolerated treatment well    Behavior During Therapy WFL for tasks assessed/performed             Past Medical History:  Diagnosis Date   Anxiety and depression    GERD (gastroesophageal reflux disease)    Insomnia    Past Surgical History:  Procedure Laterality Date   AUGMENTATION MAMMAPLASTY Bilateral 1998   saline    BREAST SURGERY Bilateral    augmentation   fallopian tube repair     RHINOPLASTY     SHOULDER OPEN ROTATOR CUFF REPAIR Left    Patient Active Problem List   Diagnosis Date Noted   Patellofemoral pain syndrome of right knee 11/28/2021   Hamstring tendinitis of left thigh 10/06/2021   Hallux rigidus of right foot 11/04/2020   Right shoulder pain 12/27/2017    PCP: Marda Stalker NP  REFERRING PROVIDER:   REFERRING DIAG: Left hamstring tear with subsequent PRP injection  THERAPY DIAG:  Pain in left leg  Cramp and spasm  Rationale for Evaluation and Treatment Rehabilitation  ONSET DATE:   SUBJECTIVE:   SUBJECTIVE STATEMENT: The patient has a history of a left hamstring insertion tear. She has had conservative treatment, PRP, and shockwave therapy. She has tried a different clinic for PRP. She comes in today with a specific protocol for return to activity from her new clinic. She continues to have pain sitting. She remains active. She has not done much since her last shot.  PERTINENT HISTORY: Anxiety, GERD, Insomnia; RTC repair   PAIN:  Are you having pain? Yes: NPRS scale: 0/10 currently pain when she sits  Pain location: ischial tuberosity Pain  description: aching  Aggravating factors: sitting  Relieving factors. Not putting pressure on it   PRECAUTIONS: None Follow protocol   WEIGHT BEARING RESTRICTIONS No  FALLS:  Has patient fallen in last 6 months? 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 She likes to go to the gym and lift weights  PATIENT GOALS  To return to active lifestyle with no pain     OBJECTIVE:   DIAGNOSTIC FINDINGS: nothing new  PATIENT SURVEYS:  FOTO    COGNITION:  Overall cognitive status: Within functional limits for tasks assessed     SENSATION: WFL  EDEMA:   MUSCLE LENGTH: Hamstrings: Right 90 deg; Left -10  deg  POSTURE: No Significant postural limitations  PALPATION: Mild tenderness to palpation in the ischial tuberosity   LOWER EXTREMITY ROM:  Passive ROM Right eval Left eval  Hip flexion Full  Not pushed to end range   Hip extension    Hip abduction    Hip adduction    Hip internal rotation    Hip external rotation    Knee flexion    Knee extension    Ankle dorsiflexion    Ankle plantarflexion    Ankle inversion    Ankle eversion     (  Blank rows = not tested)  LOWER EXTREMITY MMT:  MMT Right eval Left eval  Hip flexion 21.0 19.0  Hip extension    Hip abduction 25.2 27.2  Hip adduction    Hip internal rotation    Hip external rotation    Knee flexion    Knee extension 27.1 31  Ankle dorsiflexion    Ankle plantarflexion    Ankle inversion    Ankle eversion     (Blank rows = not tested)   GAIT: Normal gait    TODAY'S TREATMENT: Access Code: [email or web address] URL: [email or web address] Date: 06/28/2022 Prepared by: Carolyne Littles  Exercises - Quadruped Alternating Arm Lift  - 1 x daily - 7 x weekly - 3 sets - 10 reps - Quadruped Rocking Backward  - 1 x daily - 7 x weekly - 3 sets - 10 reps - Standing  Single Leg Stance with Counter Support  - 1 x daily - 7 x weekly - 3 sets - 10 reps - Modified Thomas Stretch  - 1 x daily - 7 x weekly - 3 reps - 30 sec  hold   PATIENT EDUCATION:  Education details: reviewed HEP and symptom management  Person educated: Patient Education method: Explanation, Demonstration, Tactile cues, Verbal cues, and Handouts Education comprehension: verbalized understanding, returned demonstration, verbal cues required, tactile cues required, and needs further education   HOME EXERCISE PROGRAM: Access Code: [email or web address] URL: [email or web address] Date: 06/28/2022 Prepared by: Carolyne Littles  Exercises - Quadruped Alternating Arm Lift  - 1 x daily - 7 x weekly - 3 sets - 10 reps - Quadruped Rocking Backward  - 1 x daily - 7 x weekly - 3 sets - 10 reps - Standing Single Leg Stance with Counter Support  - 1 x daily - 7 x weekly - 3 sets - 10 reps - Modified Thomas Stretch  - 1 x daily - 7 x weekly - 3 reps - 30 sec  hold  ASSESSMENT:  CLINICAL IMPRESSION: Patient is a 56 y.o. female who was seen today for physical therapy evaluation and treatment for insertion hamstring tear. She presents with a mild decrease in hamstring length on the left and decreased single leg stance on that side. She haspain in sitting. We will progress her through her protocol as tolerated and progress back to lifting if able.    OBJECTIVE IMPAIRMENTS decreased activity tolerance, decreased ROM, and pain.   ACTIVITY LIMITATIONS lifting and sitting  PARTICIPATION LIMITATIONS:  going to the gym and sitting at work   [place] Past/current experiences are also affecting patient's functional outcome.   REHAB POTENTIAL: Excellent  CLINICAL DECISION MAKING: Stable/uncomplicated  EVALUATION COMPLEXITY: Low   GOALS: Goals reviewed with patient? Yes  SHORT TERM GOALS: Target date: 07/26/2022  Patient will demonstrate equal hamstring length without pain  Baseline: Goal  status: INITIAL  2.  Patient will demonstrate equal single leg stance left vs right with progression to dynamic activity  Baseline:  Goal status: INITIAL  3.  Patient will be independent with exercises per protocol  Baseline:  Goal status: INITIAL   LONG TERM GOALS: Target date: 08/09/2022  and 08/23/2022   Patient will return to the gym without pain  Baseline:  Goal status: INITIAL  2.  Patient will sit at work for 2 hours without pain  Baseline:  Goal status: INITIAL   PLAN: PT FREQUENCY: 2x/week  PT DURATION: 8 weeks  PLANNED INTERVENTIONS: Therapeutic exercises, Therapeutic activity, Neuromuscular re-education, Balance training, Gait training, Patient/Family education, Self  Care, Joint mobilization, Aquatic Therapy, Dry Needling, Cryotherapy, Moist heat, Ultrasound, and Manual therapy  PLAN FOR NEXT SESSION: follow protocol. Add gluteal iso'shamstring iso's bike, and supine march next visit   Carney Living, PT 06/28/2022, 12:11 PM

## 2022-07-03 ENCOUNTER — Ambulatory Visit (HOSPITAL_BASED_OUTPATIENT_CLINIC_OR_DEPARTMENT_OTHER): Payer: 59 | Admitting: Physical Therapy

## 2022-07-03 DIAGNOSIS — R293 Abnormal posture: Secondary | ICD-10-CM

## 2022-07-03 DIAGNOSIS — R252 Cramp and spasm: Secondary | ICD-10-CM

## 2022-07-03 DIAGNOSIS — M542 Cervicalgia: Secondary | ICD-10-CM

## 2022-07-03 DIAGNOSIS — M76892 Other specified enthesopathies of left lower limb, excluding foot: Secondary | ICD-10-CM | POA: Diagnosis not present

## 2022-07-03 DIAGNOSIS — M79605 Pain in left leg: Secondary | ICD-10-CM

## 2022-07-03 DIAGNOSIS — R29898 Other symptoms and signs involving the musculoskeletal system: Secondary | ICD-10-CM

## 2022-07-04 ENCOUNTER — Encounter (HOSPITAL_BASED_OUTPATIENT_CLINIC_OR_DEPARTMENT_OTHER): Payer: Self-pay | Admitting: Physical Therapy

## 2022-07-04 NOTE — Therapy (Signed)
OUTPATIENT PHYSICAL THERAPY LOWER EXTREMITY Discharge    Patient Name: Sonya Munoz MRN: [id number] DOB:1966/01/27, 56 y.o., female Today's Date: 07/04/2022   PT End of Session - 07/04/22 1326     Visit Number 2    Number of Visits 16    Date for PT Re-Evaluation 08/09/22    PT Start Time 0845    PT Stop Time 0927    PT Time Calculation (min) 42 min    Activity Tolerance Patient tolerated treatment well    Behavior During Therapy WFL for tasks assessed/performed             Past Medical History:  Diagnosis Date   Anxiety and depression    GERD (gastroesophageal reflux disease)    Insomnia    Past Surgical History:  Procedure Laterality Date   AUGMENTATION MAMMAPLASTY Bilateral 1998   saline    BREAST SURGERY Bilateral    augmentation   fallopian tube repair     RHINOPLASTY     SHOULDER OPEN ROTATOR CUFF REPAIR Left    Patient Active Problem List   Diagnosis Date Noted   Patellofemoral pain syndrome of right knee 11/28/2021   Hamstring tendinitis of left thigh 10/06/2021   Hallux rigidus of right foot 11/04/2020   Right shoulder pain 12/27/2017    PCP: Marda Stalker NP  REFERRING PROVIDER:   REFERRING DIAG: Left hamstring tear with subsequent PRP injection  THERAPY DIAG:  Pain in left leg  Cramp and spasm  Cervicalgia  Other symptoms and signs involving the musculoskeletal system  Abnormal posture  Rationale for Evaluation and Treatment Rehabilitation  ONSET DATE:   SUBJECTIVE:   SUBJECTIVE STATEMENT: The patient has a history of a left hamstring insertion tear. She has had conservative treatment, PRP, and shockwave therapy. She has tried a different clinic for PRP. She comes in today with a specific protocol for return to activity from her new clinic. She continues to have pain sitting. She remains active. She has not done much since her last shot.  PERTINENT HISTORY: Anxiety, GERD, Insomnia; RTC repair   PAIN:  Are you having  pain? Yes: NPRS scale: 0/10 currently pain when she sits  Pain location: ischial tuberosity Pain description: aching  Aggravating factors: sitting  Relieving factors. Not putting pressure on it   PRECAUTIONS: None Follow protocol   WEIGHT BEARING RESTRICTIONS No  FALLS:  Has patient fallen in last 6 months? 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 She likes to go to the gym and lift weights  PATIENT GOALS  To return to active lifestyle with no pain     OBJECTIVE:   DIAGNOSTIC FINDINGS: nothing new  PATIENT SURVEYS:  FOTO    COGNITION:  Overall cognitive status: Within functional limits for tasks assessed     SENSATION: WFL  EDEMA:   MUSCLE LENGTH: Hamstrings: Right 90 deg; Left -10  deg  POSTURE: No Significant postural limitations  PALPATION: Mild tenderness to palpation in the ischial tuberosity   LOWER EXTREMITY ROM:  Passive ROM Right eval Left eval  Hip flexion Full  Not pushed to end range   Hip extension    Hip abduction    Hip adduction    Hip internal rotation    Hip external rotation    Knee flexion    Knee extension    Ankle dorsiflexion    Ankle  plantarflexion    Ankle inversion    Ankle eversion     (Blank rows = not tested)  LOWER EXTREMITY MMT:  MMT Right eval Left eval  Hip flexion 21.0 19.0  Hip extension    Hip abduction 25.2 27.2  Hip adduction    Hip internal rotation    Hip external rotation    Knee flexion    Knee extension 27.1 31  Ankle dorsiflexion    Ankle plantarflexion    Ankle inversion    Ankle eversion     (Blank rows = not tested)   GAIT: Normal gait    TODAY'S TREATMENT: 9/12 Quad rocking x15  Quad alt arm raise x15  Quad alt leg raise x15 with bent leg   Hamstring sub max iso x20    Pallof Press 2x15 10 lbs  Row 2x15 with slight knee bend  Chop 2x15  10 lbs Shoulder extension 2x15 10 lbs             Eval: Access Code: [email or web address] URL: [email or web address] Date: 06/28/2022 Prepared by: Carolyne Littles  Exercises - Quadruped Alternating Arm Lift  - 1 x daily - 7 x weekly - 3 sets - 10 reps - Quadruped Rocking Backward  - 1 x daily - 7 x weekly - 3 sets - 10 reps - Standing Single Leg Stance with Counter Support  - 1 x daily - 7 x weekly - 3 sets - 10 reps - Modified Thomas Stretch  - 1 x daily - 7 x weekly - 3 reps - 30 sec  hold   PATIENT EDUCATION:  Education details: reviewed HEP and symptom management  Person educated: Patient Education method: Explanation, Demonstration, Tactile cues, Verbal cues, and Handouts Education comprehension: verbalized understanding, returned demonstration, verbal cues required, tactile cues required, and needs further education   HOME EXERCISE PROGRAM: Access Code: [email or web address] URL: [email or web address] Date: 06/28/2022 Prepared by: Carolyne Littles  Exercises - Quadruped Alternating Arm Lift  - 1 x daily - 7 x weekly - 3 sets - 10 reps - Quadruped Rocking Backward  - 1 x daily - 7 x weekly - 3 sets - 10 reps - Standing Single Leg Stance with Counter Support  - 1 x daily - 7 x weekly - 3 sets - 10 reps - Modified Thomas Stretch  - 1 x daily - 7 x weekly - 3 reps - 30 sec  hold  ASSESSMENT:  CLINICAL IMPRESSION: The patient tolerated treatment well today. Per protocol she is not allowed to do anything other then submaximal hamstring work for a few weeks. We instead focused on core strengthening and quadruped stability. Therapy will advance patient per protocol and as tolerated.   OBJECTIVE IMPAIRMENTS decreased activity tolerance, decreased ROM, and pain.   ACTIVITY LIMITATIONS lifting and sitting  PARTICIPATION LIMITATIONS:  going to the gym and sitting at work   [place] Past/current experiences are also affecting patient's functional outcome.    REHAB POTENTIAL: Excellent  CLINICAL DECISION MAKING: Stable/uncomplicated  EVALUATION COMPLEXITY: Low   GOALS: Goals reviewed with patient? Yes  SHORT TERM GOALS: Target date: 08/01/2022  Patient will demonstrate equal hamstring length without pain  Baseline: Goal status: INITIAL  2.  Patient will demonstrate equal single leg stance left vs right with progression to dynamic activity  Baseline:  Goal status: INITIAL  3.  Patient will be independent with exercises per protocol  Baseline:  Goal status: INITIAL   LONG TERM GOALS: Target date:   and 08/29/2022  Patient will return to the gym without pain  Baseline:  Goal status: INITIAL  2.  Patient will sit at work for 2 hours without pain  Baseline:  Goal status: INITIAL   PLAN: PT FREQUENCY: 2x/week  PT DURATION: 8 weeks  PLANNED INTERVENTIONS: Therapeutic exercises, Therapeutic activity, Neuromuscular re-education, Balance training, Gait training, Patient/Family education, Self Care, Joint mobilization, Aquatic Therapy, Dry Needling, Cryotherapy, Moist heat, Ultrasound, and Manual therapy  PLAN FOR NEXT SESSION: follow protocol. Add gluteal iso'shamstring iso's bike, and supine march next visit   Carney Living, PT 07/04/2022, 1:30 PM

## 2022-07-14 ENCOUNTER — Other Ambulatory Visit (HOSPITAL_BASED_OUTPATIENT_CLINIC_OR_DEPARTMENT_OTHER): Payer: Self-pay

## 2022-07-14 MED ORDER — SHINGRIX 50 MCG/0.5ML IM SUSR
INTRAMUSCULAR | 0 refills | Status: DC
  Filled 2022-07-14: qty 1, 1d supply, fill #0

## 2022-07-17 ENCOUNTER — Other Ambulatory Visit (HOSPITAL_COMMUNITY): Payer: Self-pay

## 2022-07-17 DIAGNOSIS — Z1231 Encounter for screening mammogram for malignant neoplasm of breast: Secondary | ICD-10-CM

## 2022-07-17 MED ORDER — FLUOCINONIDE 0.05 % EX SOLN
CUTANEOUS | 0 refills | Status: AC
  Filled 2022-07-17: qty 60, 90d supply, fill #0

## 2022-07-17 MED ORDER — SERTRALINE HCL 50 MG PO TABS
50.0000 mg | ORAL_TABLET | Freq: Every day | ORAL | 3 refills | Status: DC
  Filled 2022-07-17: qty 90, 90d supply, fill #0
  Filled 2022-10-09: qty 90, 90d supply, fill #1
  Filled 2023-01-15: qty 90, 90d supply, fill #2
  Filled 2023-04-23: qty 90, 90d supply, fill #3

## 2022-07-17 MED ORDER — TRAZODONE HCL 50 MG PO TABS
25.0000 mg | ORAL_TABLET | Freq: Every day | ORAL | 0 refills | Status: DC
  Filled 2022-07-17: qty 90, 90d supply, fill #0

## 2022-07-19 ENCOUNTER — Other Ambulatory Visit (HOSPITAL_COMMUNITY): Payer: Self-pay

## 2022-07-20 ENCOUNTER — Ambulatory Visit (HOSPITAL_BASED_OUTPATIENT_CLINIC_OR_DEPARTMENT_OTHER): Payer: 59 | Admitting: Physical Therapy

## 2022-07-20 ENCOUNTER — Encounter (HOSPITAL_BASED_OUTPATIENT_CLINIC_OR_DEPARTMENT_OTHER): Payer: Self-pay | Admitting: Physical Therapy

## 2022-07-20 DIAGNOSIS — M542 Cervicalgia: Secondary | ICD-10-CM

## 2022-07-20 DIAGNOSIS — R252 Cramp and spasm: Secondary | ICD-10-CM

## 2022-07-20 DIAGNOSIS — R29898 Other symptoms and signs involving the musculoskeletal system: Secondary | ICD-10-CM

## 2022-07-20 DIAGNOSIS — M76892 Other specified enthesopathies of left lower limb, excluding foot: Secondary | ICD-10-CM | POA: Diagnosis not present

## 2022-07-20 DIAGNOSIS — M79605 Pain in left leg: Secondary | ICD-10-CM

## 2022-07-20 DIAGNOSIS — R293 Abnormal posture: Secondary | ICD-10-CM

## 2022-07-20 NOTE — Therapy (Signed)
OUTPATIENT PHYSICAL THERAPY LOWER EXTREMITY Discharge    Patient Name: Sonya Munoz MRN: [id number] DOB:Jan 10, 1966, 56 y.o., female Today's Date: 07/21/2022   PT End of Session - 07/20/22 1530     Visit Number 3    Number of Visits 16    Date for PT Re-Evaluation 08/09/22    PT Start Time 2229    PT Stop Time 1558    PT Time Calculation (min) 43 min    Activity Tolerance Patient tolerated treatment well    Behavior During Therapy WFL for tasks assessed/performed              Past Medical History:  Diagnosis Date   Anxiety and depression    GERD (gastroesophageal reflux disease)    Insomnia    Past Surgical History:  Procedure Laterality Date   AUGMENTATION MAMMAPLASTY Bilateral 1998   saline    BREAST SURGERY Bilateral    augmentation   fallopian tube repair     RHINOPLASTY     SHOULDER OPEN ROTATOR CUFF REPAIR Left    Patient Active Problem List   Diagnosis Date Noted   Patellofemoral pain syndrome of right knee 11/28/2021   Hamstring tendinitis of left thigh 10/06/2021   Hallux rigidus of right foot 11/04/2020   Right shoulder pain 12/27/2017    PCP: Marda Stalker NP  REFERRING PROVIDER:   REFERRING DIAG: Left hamstring tear with subsequent PRP injection  THERAPY DIAG:  Pain in left leg  Cramp and spasm  Cervicalgia  Other symptoms and signs involving the musculoskeletal system  Abnormal posture  Rationale for Evaluation and Treatment Rehabilitation  ONSET DATE:   SUBJECTIVE:   SUBJECTIVE STATEMENT: The patient continues to have some pain when sitting. Overall she feels like it has improved a little but it still hurts.  PERTINENT HISTORY: Anxiety, GERD, Insomnia; RTC repair   PAIN:  Are you having pain? Yes: NPRS scale: 0/10 currently pain when she sits  Pain location: ischial tuberosity Pain description: aching  Aggravating factors: sitting  Relieving factors. Not putting pressure on it   PRECAUTIONS: None Follow  protocol   WEIGHT BEARING RESTRICTIONS No  FALLS:  Has patient fallen in last 6 months? 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 She likes to go to the gym and lift weights  PATIENT GOALS  To return to active lifestyle with no pain     OBJECTIVE:   DIAGNOSTIC FINDINGS: nothing new  PATIENT SURVEYS:  FOTO    COGNITION:  Overall cognitive status: Within functional limits for tasks assessed     SENSATION: WFL  EDEMA:   MUSCLE LENGTH: Hamstrings: Right 90 deg; Left -10  deg  POSTURE: No Significant postural limitations  PALPATION: Mild tenderness to palpation in the ischial tuberosity   LOWER EXTREMITY ROM:  Passive ROM Right eval Left eval  Hip flexion Full  Not pushed to end range   Hip extension    Hip abduction    Hip adduction    Hip internal rotation    Hip external rotation    Knee flexion    Knee extension    Ankle dorsiflexion    Ankle plantarflexion    Ankle inversion    Ankle eversion     (Blank rows = not tested)  LOWER EXTREMITY MMT:  MMT Right eval Left eval  Hip flexion 21.0 19.0  Hip extension  Hip abduction 25.2 27.2  Hip adduction    Hip internal rotation    Hip external rotation    Knee flexion    Knee extension 27.1 31  Ankle dorsiflexion    Ankle plantarflexion    Ankle inversion    Ankle eversion     (Blank rows = not tested)   GAIT: Normal gait    TODAY'S TREATMENT: 9/29 Criteria for progression to phase 3 was 90 degree iso. No increase in pain.   Per phase 3   Hamstring isos on ball 3x15 sec  Knee to chest with submax heel pressure 2x15   Reviewed light hamstring stretching seated; 2x20 sec hold   Lateral band walk green 50'x2  Forward band walk 50'x2  Backwards band walk 50'x2  Monster walk 50'x2   Step up and hold x20 6 inch  Step down x20 6 inch  Lateral  step and hold x20 6 inch   Cable walk 20lbs 10x  Fwd and back     9/12 Quad rocking x15  Quad alt arm raise x15  Quad alt leg raise x15 with bent leg   Hamstring sub max iso x20    Pallof Press 2x15 10 lbs  Row 2x15 with slight knee bend  Chop 2x15 10 lbs Shoulder extension 2x15 10 lbs             Eval: Access Code: [email or web address] URL: [email or web address] Date: 06/28/2022 Prepared by: Carolyne Littles  Exercises - Quadruped Alternating Arm Lift  - 1 x daily - 7 x weekly - 3 sets - 10 reps - Quadruped Rocking Backward  - 1 x daily - 7 x weekly - 3 sets - 10 reps - Standing Single Leg Stance with Counter Support  - 1 x daily - 7 x weekly - 3 sets - 10 reps - Modified Thomas Stretch  - 1 x daily - 7 x weekly - 3 reps - 30 sec  hold   PATIENT EDUCATION:  Education details: reviewed HEP and symptom management  Person educated: Patient Education method: Explanation, Demonstration, Tactile cues, Verbal cues, and Handouts Education comprehension: verbalized understanding, returned demonstration, verbal cues required, tactile cues required, and needs further education   HOME EXERCISE PROGRAM: Access Code: [email or web address] URL: [email or web address] Date: 06/28/2022 Prepared by: Carolyne Littles  Exercises - Quadruped Alternating Arm Lift  - 1 x daily - 7 x weekly - 3 sets - 10 reps - Quadruped Rocking Backward  - 1 x daily - 7 x weekly - 3 sets - 10 reps - Standing Single Leg Stance with Counter Support  - 1 x daily - 7 x weekly - 3 sets - 10 reps - Modified Thomas Stretch  - 1 x daily - 7 x weekly - 3 reps - 30 sec  hold  ASSESSMENT:  CLINICAL IMPRESSION: The patient is still frustrated by having some pain but she is still early on. She is 4 weeks post procedure. She had no pain with CKC exercises and with low range hip stabilization exercises. She passed the test to move to phase 3 of the protocol. Therapy will continue to progress as tolerated.    OBJECTIVE IMPAIRMENTS decreased activity tolerance, decreased ROM, and pain.   ACTIVITY LIMITATIONS lifting and sitting  PARTICIPATION LIMITATIONS:  going to the gym and sitting at work   [place] Past/current experiences are also affecting patient's functional outcome.   REHAB POTENTIAL: Excellent  CLINICAL DECISION MAKING: Stable/uncomplicated  EVALUATION COMPLEXITY: Low   GOALS: Goals reviewed with patient?  Yes  SHORT TERM GOALS: Target date: 08/01/2022  Patient will demonstrate equal hamstring length without pain  Baseline: Goal status: INITIAL  2.  Patient will demonstrate equal single leg stance left vs right with progression to dynamic activity  Baseline:  Goal status: INITIAL  3.  Patient will be independent with exercises per protocol  Baseline:  Goal status: INITIAL   LONG TERM GOALS: Target date:   and 08/29/2022   Patient will return to the gym without pain  Baseline:  Goal status: INITIAL  2.  Patient will sit at work for 2 hours without pain  Baseline:  Goal status: INITIAL   PLAN: PT FREQUENCY: 2x/week  PT DURATION: 8 weeks  PLANNED INTERVENTIONS: Therapeutic exercises, Therapeutic activity, Neuromuscular re-education, Balance training, Gait training, Patient/Family education, Self Care, Joint mobilization, Aquatic Therapy, Dry Needling, Cryotherapy, Moist heat, Ultrasound, and Manual therapy  PLAN FOR NEXT SESSION: follow protocol. Add gluteal iso'shamstring iso's bike, and supine march next visit   Carney Living, PT 07/21/2022, 9:51 AM

## 2022-07-21 ENCOUNTER — Encounter (HOSPITAL_BASED_OUTPATIENT_CLINIC_OR_DEPARTMENT_OTHER): Payer: Self-pay | Admitting: Physical Therapy

## 2022-07-26 ENCOUNTER — Ambulatory Visit (HOSPITAL_BASED_OUTPATIENT_CLINIC_OR_DEPARTMENT_OTHER): Payer: 59 | Attending: Family Medicine | Admitting: Physical Therapy

## 2022-07-26 ENCOUNTER — Encounter (HOSPITAL_BASED_OUTPATIENT_CLINIC_OR_DEPARTMENT_OTHER): Payer: Self-pay | Admitting: Physical Therapy

## 2022-07-26 DIAGNOSIS — M542 Cervicalgia: Secondary | ICD-10-CM | POA: Diagnosis not present

## 2022-07-26 DIAGNOSIS — R293 Abnormal posture: Secondary | ICD-10-CM | POA: Insufficient documentation

## 2022-07-26 DIAGNOSIS — R252 Cramp and spasm: Secondary | ICD-10-CM | POA: Diagnosis not present

## 2022-07-26 DIAGNOSIS — M79605 Pain in left leg: Secondary | ICD-10-CM | POA: Insufficient documentation

## 2022-07-26 DIAGNOSIS — R29898 Other symptoms and signs involving the musculoskeletal system: Secondary | ICD-10-CM | POA: Insufficient documentation

## 2022-07-26 NOTE — Therapy (Signed)
OUTPATIENT PHYSICAL THERAPY LOWER EXTREMITY Discharge    Patient Name: Sonya Munoz MRN: [id number] DOB:[birth year], 56 y.o., female Today's Date: 07/26/2022   PT End of Session - 07/26/22 1435     Visit Number 4    Number of Visits 16    Date for PT Re-Evaluation 08/09/22    PT Start Time 60    PT Stop Time 1505   Patient had a meeting   PT Time Calculation (min) 35 min    Activity Tolerance Patient tolerated treatment well    Behavior During Therapy WFL for tasks assessed/performed              Past Medical History:  Diagnosis Date   Anxiety and depression    GERD (gastroesophageal reflux disease)    Insomnia    Past Surgical History:  Procedure Laterality Date   AUGMENTATION MAMMAPLASTY Bilateral 1998   saline    BREAST SURGERY Bilateral    augmentation   fallopian tube repair     RHINOPLASTY     SHOULDER OPEN ROTATOR CUFF REPAIR Left    Patient Active Problem List   Diagnosis Date Noted   Patellofemoral pain syndrome of right knee 11/28/2021   Hamstring tendinitis of left thigh 10/06/2021   Hallux rigidus of right foot 11/04/2020   Right shoulder pain 12/27/2017    PCP: Marda Stalker NP  REFERRING PROVIDER:   REFERRING DIAG: Left hamstring tear with subsequent PRP injection  THERAPY DIAG:  Pain in left leg  Rationale for Evaluation and Treatment Rehabilitation  ONSET DATE:   SUBJECTIVE:   SUBJECTIVE STATEMENT: The patient continues to have some pain when sitting. She started walking outside without much pain.  PERTINENT HISTORY: Anxiety, GERD, Insomnia; RTC repair   PAIN:  Are you having pain? Yes: NPRS scale: 3/10 currently pain when she sits  Pain location: ischial tuberosity Pain description: aching  Aggravating factors: sitting  Relieving factors. Not putting pressure on it   PRECAUTIONS: None Follow protocol   WEIGHT BEARING RESTRICTIONS No  FALLS:  Has patient fallen in last 6 months? 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 She likes to go to the gym and lift weights  PATIENT GOALS  To return to active lifestyle with no pain     OBJECTIVE:   DIAGNOSTIC FINDINGS: nothing new  PATIENT SURVEYS:  FOTO    COGNITION:  Overall cognitive status: Within functional limits for tasks assessed     SENSATION: WFL  EDEMA:   MUSCLE LENGTH: Hamstrings: Right 90 deg; Left -10  deg  POSTURE: No Significant postural limitations  PALPATION: Mild tenderness to palpation in the ischial tuberosity   LOWER EXTREMITY ROM:  Passive ROM Right eval Left eval  Hip flexion Full  Not pushed to end range   Hip extension    Hip abduction    Hip adduction    Hip internal rotation    Hip external rotation    Knee flexion    Knee extension    Ankle dorsiflexion    Ankle plantarflexion    Ankle inversion    Ankle eversion     (Blank rows = not tested)  LOWER EXTREMITY MMT:  MMT Right eval Left eval  Hip flexion 21.0 19.0  Hip extension    Hip abduction 25.2 27.2  Hip adduction    Hip internal rotation    Hip  external rotation    Knee flexion    Knee extension 27.1 31  Ankle dorsiflexion    Ankle plantarflexion    Ankle inversion    Ankle eversion     (Blank rows = not tested)   GAIT: Normal gait    TODAY'S TREATMENT: 10/4  Per phase 3   Hamstring isos on ball 3x15 sec  Knee to chest with submax heel pressure 2x15   Elliptical 5 min L3   Forward band walk 50'x2  Backwards band walk 50'x2               Step down x20 6 inch   Ball press x20  Prone hip extension x20  Bridge [employer] press DKTC x20   Dead lift 3x15 1st set 10 lbs 2nd 3rd set 15 lbs        9/29 Criteria for progression to phase 3 was 90 degree iso. No increase in pain.   Per phase 3   Hamstring isos on ball 3x15 sec  Knee to chest with  submax heel pressure 2x15   Reviewed light hamstring stretching seated; 2x20 sec hold   Lateral band walk green 50'x2  Forward band walk 50'x2  Backwards band walk 50'x2  Monster walk 50'x2   Step up and hold x20 6 inch  Step down x20 6 inch  Lateral step and hold x20 6 inch   Cable walk 20lbs 10x  Fwd and back     PATIENT EDUCATION:  Education details: reviewed HEP and symptom management  Person educated: Patient Education method: Explanation, Demonstration, Tactile cues, Verbal cues, and Handouts Education comprehension: verbalized understanding, returned demonstration, verbal cues required, tactile cues required, and needs further education   HOME EXERCISE PROGRAM: Access Code: [email or web address] URL: [email or web address] Date: 06/28/2022 Prepared by: Carolyne Littles  Exercises - Quadruped Alternating Arm Lift  - 1 x daily - 7 x weekly - 3 sets - 10 reps - Quadruped Rocking Backward  - 1 x daily - 7 x weekly - 3 sets - 10 reps - Standing Single Leg Stance with Counter Support  - 1 x daily - 7 x weekly - 3 sets - 10 reps - Modified Thomas Stretch  - 1 x daily - 7 x weekly - 3 reps - 30 sec  hold  ASSESSMENT:  CLINICAL IMPRESSION: The patient has not had any increased pain with her new exercises. We added in a dead lift from an elevated surface this visit with light weight. We also added in the elliptical. She has been walking more. We looked at her hamstring length today. It was improved but still limited compared to the right  OBJECTIVE IMPAIRMENTS decreased activity tolerance, decreased ROM, and pain.   ACTIVITY LIMITATIONS lifting and sitting  PARTICIPATION LIMITATIONS:  going to the gym and sitting at work   [place] Past/current experiences are also affecting patient's functional outcome.   REHAB POTENTIAL: Excellent  CLINICAL DECISION MAKING: Stable/uncomplicated  EVALUATION COMPLEXITY: Low   GOALS: Goals reviewed with patient? Yes  SHORT  TERM GOALS: Target date: 08/01/2022  Patient will demonstrate equal hamstring length without pain  Baseline: Goal status: INITIAL  2.  Patient will demonstrate equal single leg stance left vs right with progression to dynamic activity  Baseline:  Goal status: INITIAL  3.  Patient will be independent with exercises per protocol  Baseline:  Goal status: INITIAL   LONG TERM GOALS: Target date:   and 08/29/2022   Patient will return to the gym  without pain  Baseline:  Goal status: INITIAL  2.  Patient will sit at work for 2 hours without pain  Baseline:  Goal status: INITIAL   PLAN: PT FREQUENCY: 2x/week  PT DURATION: 8 weeks  PLANNED INTERVENTIONS: Therapeutic exercises, Therapeutic activity, Neuromuscular re-education, Balance training, Gait training, Patient/Family education, Self Care, Joint mobilization, Aquatic Therapy, Dry Needling, Cryotherapy, Moist heat, Ultrasound, and Manual therapy  PLAN FOR NEXT SESSION: follow protocol. Add gluteal iso'shamstring iso's bike, and supine march next visit   Carney Living, PT 07/26/2022, 3:13 PM

## 2022-07-28 DIAGNOSIS — M76892 Other specified enthesopathies of left lower limb, excluding foot: Secondary | ICD-10-CM | POA: Diagnosis not present

## 2022-08-02 ENCOUNTER — Ambulatory Visit (HOSPITAL_BASED_OUTPATIENT_CLINIC_OR_DEPARTMENT_OTHER): Payer: 59 | Admitting: Physical Therapy

## 2022-08-02 ENCOUNTER — Encounter (HOSPITAL_BASED_OUTPATIENT_CLINIC_OR_DEPARTMENT_OTHER): Payer: Self-pay | Admitting: Physical Therapy

## 2022-08-02 DIAGNOSIS — M79605 Pain in left leg: Secondary | ICD-10-CM | POA: Diagnosis not present

## 2022-08-02 DIAGNOSIS — R293 Abnormal posture: Secondary | ICD-10-CM | POA: Diagnosis not present

## 2022-08-02 DIAGNOSIS — R252 Cramp and spasm: Secondary | ICD-10-CM

## 2022-08-02 DIAGNOSIS — M542 Cervicalgia: Secondary | ICD-10-CM | POA: Diagnosis not present

## 2022-08-02 DIAGNOSIS — R29898 Other symptoms and signs involving the musculoskeletal system: Secondary | ICD-10-CM | POA: Diagnosis not present

## 2022-08-02 NOTE — Therapy (Signed)
OUTPATIENT PHYSICAL THERAPY LOWER EXTREMITY Discharge    Patient Name: Sonya Munoz MRN: [id number] DOB:August 24, 1966, 56 y.o., female Today's Date: 08/02/2022   PT End of Session - 08/02/22 0833     Visit Number 5    Number of Visits 16    Date for PT Re-Evaluation 08/09/22    PT Start Time 0802    PT Stop Time 0843    PT Time Calculation (min) 41 min    Activity Tolerance Patient tolerated treatment well    Behavior During Therapy WFL for tasks assessed/performed               Past Medical History:  Diagnosis Date   Anxiety and depression    GERD (gastroesophageal reflux disease)    Insomnia    Past Surgical History:  Procedure Laterality Date   AUGMENTATION MAMMAPLASTY Bilateral 1998   saline    BREAST SURGERY Bilateral    augmentation   fallopian tube repair     RHINOPLASTY     SHOULDER OPEN ROTATOR CUFF REPAIR Left    Patient Active Problem List   Diagnosis Date Noted   Patellofemoral pain syndrome of right knee 11/28/2021   Hamstring tendinitis of left thigh 10/06/2021   Hallux rigidus of right foot 11/04/2020   Right shoulder pain 12/27/2017    PCP: Marda Stalker NP  REFERRING PROVIDER:   REFERRING DIAG: Left hamstring tear with subsequent PRP injection  THERAPY DIAG:  Pain in left leg  Cramp and spasm  Rationale for Evaluation and Treatment Rehabilitation  ONSET DATE:   SUBJECTIVE:   SUBJECTIVE STATEMENT: The patient continues to have some pain when sitting. She started walking outside without much pain.  PERTINENT HISTORY: Anxiety, GERD, Insomnia; RTC repair   PAIN:  Are you having pain? Yes: NPRS scale: 3/10 currently pain when she sits  Pain location: ischial tuberosity Pain description: aching  Aggravating factors: sitting  Relieving factors. Not putting pressure on it   PRECAUTIONS: None Follow protocol   WEIGHT BEARING RESTRICTIONS No  FALLS:  Has patient fallen in last 6 months? 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 She likes to go to the gym and lift weights  PATIENT GOALS  To return to active lifestyle with no pain     OBJECTIVE:   DIAGNOSTIC FINDINGS: nothing new  PATIENT SURVEYS:  FOTO    COGNITION:  Overall cognitive status: Within functional limits for tasks assessed     SENSATION: WFL  EDEMA:   MUSCLE LENGTH: Hamstrings: Right 90 deg; Left -10  deg  POSTURE: No Significant postural limitations  PALPATION: Mild tenderness to palpation in the ischial tuberosity   LOWER EXTREMITY ROM:  Passive ROM Right eval Left eval  Hip flexion Full  Not pushed to end range   Hip extension    Hip abduction    Hip adduction    Hip internal rotation    Hip external rotation    Knee flexion    Knee extension    Ankle dorsiflexion    Ankle plantarflexion    Ankle inversion    Ankle eversion     (Blank rows = not tested)  LOWER EXTREMITY MMT:  MMT Right eval Left eval  Hip flexion 21.0 19.0  Hip extension    Hip abduction 25.2 27.2  Hip adduction    Hip internal rotation    Hip  external rotation    Knee flexion    Knee extension 27.1 31  Ankle dorsiflexion    Ankle plantarflexion    Ankle inversion    Ankle eversion     (Blank rows = not tested)   GAIT: Normal gait    TODAY'S TREATMENT: 1011             Step down x20 6 inch   Ball press x20  Prone hip extension x20  Bridge x20  Ball press DKTC x20   Elliptical for warm up 5 min   Leg press 3x15 55 lbs  Hip abduction 55 lbs 3x15  Knee extension 3x15 30 lbs  Hip adduction 3x15 45 lbs     10/4  Per phase 3   Hamstring isos on ball 3x15 sec  Knee to chest with submax heel pressure 2x15   Elliptical 5 min L3   Forward band walk 50'x2  Backwards band walk 50'x2               Step down x20 6 inch   Ball press x20  Prone hip  extension x20  Bridge [employer] press DKTC x20   Dead lift 3x15 1st set 10 lbs 2nd 3rd set 15 lbs        9/29 Criteria for progression to phase 3 was 90 degree iso. No increase in pain.   Per phase 3   Hamstring isos on ball 3x15 sec  Knee to chest with submax heel pressure 2x15   Reviewed light hamstring stretching seated; 2x20 sec hold   Lateral band walk green 50'x2  Forward band walk 50'x2  Backwards band walk 50'x2  Monster walk 50'x2   Step up and hold x20 6 inch  Step down x20 6 inch  Lateral step and hold x20 6 inch   Cable walk 20lbs 10x  Fwd and back     PATIENT EDUCATION:  Education details: reviewed HEP and symptom management  Person educated: Patient Education method: Explanation, Demonstration, Tactile cues, Verbal cues, and Handouts Education comprehension: verbalized understanding, returned demonstration, verbal cues required, tactile cues required, and needs further education   HOME EXERCISE PROGRAM: Access Code: [email or web address] URL: [email or web address] Date: 06/28/2022 Prepared by: Carolyne Littles  Exercises - Quadruped Alternating Arm Lift  - 1 x daily - 7 x weekly - 3 sets - 10 reps - Quadruped Rocking Backward  - 1 x daily - 7 x weekly - 3 sets - 10 reps - Standing Single Leg Stance with Counter Support  - 1 x daily - 7 x weekly - 3 sets - 10 reps - Modified Thomas Stretch  - 1 x daily - 7 x weekly - 3 reps - 30 sec  hold  ASSESSMENT:  CLINICAL IMPRESSION: The patient is making good progress. She tolerated treatment well. She was able to do lifting activity in the gym without much pain. We reviewed technique with exercises. She will work on her exercises for a week or two. We will follow up at that point and make a plan going forward. She has a full program at this point.   OBJECTIVE IMPAIRMENTS decreased activity tolerance, decreased ROM, and pain.   ACTIVITY LIMITATIONS lifting and sitting  PARTICIPATION LIMITATIONS:  going  to the gym and sitting at work   [place] Past/current experiences are also affecting patient's functional outcome.   REHAB POTENTIAL: Excellent  CLINICAL DECISION MAKING: Stable/uncomplicated  EVALUATION COMPLEXITY: Low   GOALS: Goals reviewed with patient? Yes  SHORT  TERM GOALS: Target date: 08/01/2022  Patient will demonstrate equal hamstring length without pain  Baseline: Goal status: INITIAL  2.  Patient will demonstrate equal single leg stance left vs right with progression to dynamic activity  Baseline:  Goal status: INITIAL  3.  Patient will be independent with exercises per protocol  Baseline:  Goal status: INITIAL   LONG TERM GOALS: Target date:   and 08/29/2022   Patient will return to the gym without pain  Baseline:  Goal status: INITIAL  2.  Patient will sit at work for 2 hours without pain  Baseline:  Goal status: INITIAL   PLAN: PT FREQUENCY: 2x/week  PT DURATION: 8 weeks  PLANNED INTERVENTIONS: Therapeutic exercises, Therapeutic activity, Neuromuscular re-education, Balance training, Gait training, Patient/Family education, Self Care, Joint mobilization, Aquatic Therapy, Dry Needling, Cryotherapy, Moist heat, Ultrasound, and Manual therapy  PLAN FOR NEXT SESSION: follow protocol. Add gluteal iso'shamstring iso's bike, and supine march next visit   Carney Living, PT 08/02/2022, 11:01 AM

## 2022-08-09 ENCOUNTER — Encounter (HOSPITAL_BASED_OUTPATIENT_CLINIC_OR_DEPARTMENT_OTHER): Payer: 59 | Admitting: Physical Therapy

## 2022-08-16 ENCOUNTER — Ambulatory Visit (HOSPITAL_BASED_OUTPATIENT_CLINIC_OR_DEPARTMENT_OTHER): Payer: 59 | Admitting: Physical Therapy

## 2022-08-16 ENCOUNTER — Encounter (HOSPITAL_BASED_OUTPATIENT_CLINIC_OR_DEPARTMENT_OTHER): Payer: Self-pay | Admitting: Physical Therapy

## 2022-08-16 DIAGNOSIS — R252 Cramp and spasm: Secondary | ICD-10-CM

## 2022-08-16 DIAGNOSIS — M542 Cervicalgia: Secondary | ICD-10-CM

## 2022-08-16 DIAGNOSIS — M79605 Pain in left leg: Secondary | ICD-10-CM | POA: Diagnosis not present

## 2022-08-16 DIAGNOSIS — R293 Abnormal posture: Secondary | ICD-10-CM | POA: Diagnosis not present

## 2022-08-16 DIAGNOSIS — R29898 Other symptoms and signs involving the musculoskeletal system: Secondary | ICD-10-CM

## 2022-08-16 NOTE — Therapy (Signed)
OUTPATIENT PHYSICAL THERAPY LOWER EXTREMITY Discharge    Patient Name: Sonya Munoz MRN: [id number] DOB:12-03-1965, 56 y.o., female Today's Date: 08/16/2022   PT End of Session - 08/16/22 0942     Visit Number 6    Number of Visits 16    Date for PT Re-Evaluation 10/11/22    PT Start Time 0930    PT Stop Time 1013    PT Time Calculation (min) 43 min    Activity Tolerance Patient tolerated treatment well    Behavior During Therapy WFL for tasks assessed/performed               Past Medical History:  Diagnosis Date   Anxiety and depression    GERD (gastroesophageal reflux disease)    Insomnia    Past Surgical History:  Procedure Laterality Date   AUGMENTATION MAMMAPLASTY Bilateral 1998   saline    BREAST SURGERY Bilateral    augmentation   fallopian tube repair     RHINOPLASTY     SHOULDER OPEN ROTATOR CUFF REPAIR Left    Patient Active Problem List   Diagnosis Date Noted   Patellofemoral pain syndrome of right knee 11/28/2021   Hamstring tendinitis of left thigh 10/06/2021   Hallux rigidus of right foot 11/04/2020   Right shoulder pain 12/27/2017    PCP: Marda Stalker NP  REFERRING PROVIDER:   REFERRING DIAG: Left hamstring tear with subsequent PRP injection  THERAPY DIAG:  Pain in left leg - Plan: PT plan of care cert/re-cert  Cramp and spasm - Plan: PT plan of care cert/re-cert  Cervicalgia - Plan: PT plan of care cert/re-cert  Other symptoms and signs involving the musculoskeletal system - Plan: PT plan of care cert/re-cert  Abnormal posture - Plan: PT plan of care cert/re-cert  Rationale for Evaluation and Treatment Rehabilitation  ONSET DATE:   SUBJECTIVE:   SUBJECTIVE STATEMENT: The patient continues to have some pain when sitting. She started walking outside without much pain.  PERTINENT HISTORY: Anxiety, GERD, Insomnia; RTC repair   PAIN:  Are you having pain? Yes: NPRS scale: 3/10 currently pain when she sits  Pain  location: ischial tuberosity Pain description: aching  Aggravating factors: sitting  Relieving factors. Not putting pressure on it   PRECAUTIONS: None Follow protocol   WEIGHT BEARING RESTRICTIONS No  FALLS:  Has patient fallen in last 6 months? 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 She likes to go to the gym and lift weights  PATIENT GOALS  To return to active lifestyle with no pain     OBJECTIVE:   DIAGNOSTIC FINDINGS: nothing new  PATIENT SURVEYS:  FOTO    COGNITION:  Overall cognitive status: Within functional limits for tasks assessed     SENSATION: WFL  EDEMA:   MUSCLE LENGTH: Hamstrings: Right 90 deg; Left -10  deg  POSTURE: No Significant postural limitations  PALPATION: Mild tenderness to palpation in the ischial tuberosity   LOWER EXTREMITY ROM:  Passive ROM Right eval Left eval  Hip flexion Full  Not pushed to end range   Hip extension    Hip abduction    Hip adduction    Hip internal rotation    Hip external rotation    Knee flexion    Knee extension    Ankle dorsiflexion    Ankle plantarflexion    Ankle inversion  Ankle eversion     (Blank rows = not tested)  LOWER EXTREMITY MMT:  MMT Right eval Left eval Right  10/25 Left  10/25   Hip flexion 21.0 19.0    Hip extension   22.3 19.7  Hip abduction 25.2 27.2    Hip adduction      Hip internal rotation      Hip external rotation      Knee flexion   15.9 18.6  Knee extension 27.1 31    Ankle dorsiflexion      Ankle plantarflexion      Ankle inversion      Ankle eversion       (Blank rows = not tested) Full rangew   GAIT: Normal gait    TODAY'S TREATMENT: 10/25 Incline treadmill 7 min with progressive incline   Air-ex reach 2x15  Air-ex hop 2x20 sec lateral and forward   TRX squat x20  TRX pistol squat x20   Ball press  x20  Prone hip extension x20  [employer] press DKTC x20   Reviewed strength measurements          1011             Step down x20 6 inch   Ball press x20  Prone hip extension x20  Bridge [employer] press DKTC x20   Elliptical for warm up 5 min   Leg press 3x15 55 lbs  Hip abduction 55 lbs 3x15  Knee extension 3x15 30 lbs  Hip adduction 3x15 45 lbs     10/4  Per phase 3   Hamstring isos on ball 3x15 sec  Knee to chest with submax heel pressure 2x15   Elliptical 5 min L3   Forward band walk 50'x2  Backwards band walk 50'x2               Step down x20 6 inch   Ball press x20  Prone hip extension x20  Bridge [employer] press DKTC x20   Dead lift 3x15 1st set 10 lbs 2nd 3rd set 15 lbs          PATIENT EDUCATION:  Education details: reviewed HEP and symptom management  Person educated: Patient Education method: Explanation, Demonstration, Tactile cues, Verbal cues, and Handouts Education comprehension: verbalized understanding, returned demonstration, verbal cues required, tactile cues required, and needs further education   HOME EXERCISE PROGRAM: Access Code: [email or web address] URL: [email or web address] Date: 06/28/2022 Prepared by: Carolyne Littles  Exercises - Quadruped Alternating Arm Lift  - 1 x daily - 7 x weekly - 3 sets - 10 reps - Quadruped Rocking Backward  - 1 x daily - 7 x weekly - 3 sets - 10 reps - Standing Single Leg Stance with Counter Support  - 1 x daily - 7 x weekly - 3 sets - 10 reps - Modified Thomas Stretch  - 1 x daily - 7 x weekly - 3 reps - 30 sec  hold  ASSESSMENT:  CLINICAL IMPRESSION: The patient continues to make progress. She is back to full a full lifting program. She has some pain at times when she sits but she feels like it is not as intense. We reviewed some different exercises that she can do at this time. We added TRX today. She had no pain in her hamstring. Therapy will continue to progress as  tolerated. Her strength was equal with both hamstring measures. She has full ROM.   OBJECTIVE IMPAIRMENTS decreased activity tolerance, decreased ROM,  and pain.   ACTIVITY LIMITATIONS lifting and sitting  PARTICIPATION LIMITATIONS:  going to the gym and sitting at work   [place] Past/current experiences are also affecting patient's functional outcome.   REHAB POTENTIAL: Excellent  CLINICAL DECISION MAKING: Stable/uncomplicated  EVALUATION COMPLEXITY: Low   GOALS: Goals reviewed with patient? Yes  SHORT TERM GOALS: Target date: 08/01/2022  Patient will demonstrate equal hamstring length without pain  Baseline: Goal status: Achieved 10/12  2.  Patient will demonstrate equal single leg stance left vs right with progression to dynamic activity  Baseline:  Goal status: Achieved with air-ex 10/25  3.  Patient will be independent with exercises per protocol  Baseline:  Goal status: still adding exercises    LONG TERM GOALS: Target date:   and 08/29/2022   Patient will return to the gym without pain  Baseline:  Goal status: has returned to the gym Archived   2.  Patient will sit at work for 2 hours without pain  Baseline:  Goal status: Not met    PLAN: PT FREQUENCY: 1x  PT DURATION: 8 weeks  PLANNED INTERVENTIONS: Therapeutic exercises, Therapeutic activity, Neuromuscular re-education, Balance training, Gait training, Patient/Family education, Self Care, Joint mobilization, Aquatic Therapy, Dry Needling, Cryotherapy, Moist heat, Ultrasound, and Manual therapy  PLAN FOR NEXT SESSION: follow protocol. Add gluteal iso'shamstring iso's bike, and supine march next visit   Carney Living, PT 08/16/2022, 11:52 AM

## 2022-08-17 ENCOUNTER — Ambulatory Visit: Payer: 59

## 2022-08-27 ENCOUNTER — Other Ambulatory Visit (HOSPITAL_COMMUNITY): Payer: Self-pay

## 2022-08-30 ENCOUNTER — Other Ambulatory Visit (HOSPITAL_COMMUNITY): Payer: Self-pay

## 2022-08-30 MED ORDER — LISINOPRIL 10 MG PO TABS
10.0000 mg | ORAL_TABLET | Freq: Every day | ORAL | 0 refills | Status: DC
  Filled 2022-08-30: qty 90, 90d supply, fill #0

## 2022-09-01 ENCOUNTER — Encounter (HOSPITAL_BASED_OUTPATIENT_CLINIC_OR_DEPARTMENT_OTHER): Payer: 59 | Admitting: Radiology

## 2022-09-01 DIAGNOSIS — Z1231 Encounter for screening mammogram for malignant neoplasm of breast: Secondary | ICD-10-CM

## 2022-09-08 DIAGNOSIS — M76892 Other specified enthesopathies of left lower limb, excluding foot: Secondary | ICD-10-CM | POA: Diagnosis not present

## 2022-09-11 ENCOUNTER — Other Ambulatory Visit (HOSPITAL_BASED_OUTPATIENT_CLINIC_OR_DEPARTMENT_OTHER): Payer: Self-pay | Admitting: Family Medicine

## 2022-09-11 DIAGNOSIS — Z1231 Encounter for screening mammogram for malignant neoplasm of breast: Secondary | ICD-10-CM

## 2022-09-25 ENCOUNTER — Ambulatory Visit (HOSPITAL_BASED_OUTPATIENT_CLINIC_OR_DEPARTMENT_OTHER)
Admission: RE | Admit: 2022-09-25 | Discharge: 2022-09-25 | Disposition: A | Discharge status: Home / Self Care | Payer: 59 | Source: Ambulatory Visit | Attending: Family Medicine | Admitting: Family Medicine

## 2022-09-25 DIAGNOSIS — Z1231 Encounter for screening mammogram for malignant neoplasm of breast: Secondary | ICD-10-CM | POA: Diagnosis not present

## 2022-10-11 ENCOUNTER — Other Ambulatory Visit (HOSPITAL_COMMUNITY): Payer: Self-pay

## 2022-10-11 MED ORDER — PREMPRO 0.3-1.5 MG PO TABS
1.0000 | ORAL_TABLET | Freq: Every day | ORAL | 0 refills | Status: DC
  Filled 2022-10-11: qty 84, 84d supply, fill #0
  Filled ????-??-??: fill #1

## 2022-11-13 DIAGNOSIS — D485 Neoplasm of uncertain behavior of skin: Secondary | ICD-10-CM | POA: Diagnosis not present

## 2022-11-13 DIAGNOSIS — L910 Hypertrophic scar: Secondary | ICD-10-CM | POA: Diagnosis not present

## 2022-11-13 DIAGNOSIS — Z419 Encounter for procedure for purposes other than remedying health state, unspecified: Secondary | ICD-10-CM | POA: Diagnosis not present

## 2022-11-13 DIAGNOSIS — D225 Melanocytic nevi of trunk: Secondary | ICD-10-CM | POA: Diagnosis not present

## 2022-11-13 DIAGNOSIS — Z85828 Personal history of other malignant neoplasm of skin: Secondary | ICD-10-CM | POA: Diagnosis not present

## 2022-11-13 DIAGNOSIS — L821 Other seborrheic keratosis: Secondary | ICD-10-CM | POA: Diagnosis not present

## 2022-11-13 DIAGNOSIS — D2261 Melanocytic nevi of right upper limb, including shoulder: Secondary | ICD-10-CM | POA: Diagnosis not present

## 2022-11-13 DIAGNOSIS — B078 Other viral warts: Secondary | ICD-10-CM | POA: Diagnosis not present

## 2022-11-13 DIAGNOSIS — L570 Actinic keratosis: Secondary | ICD-10-CM | POA: Diagnosis not present

## 2022-11-19 ENCOUNTER — Other Ambulatory Visit (HOSPITAL_COMMUNITY): Payer: Self-pay

## 2022-11-20 ENCOUNTER — Other Ambulatory Visit (HOSPITAL_COMMUNITY): Payer: Self-pay

## 2022-11-20 MED ORDER — LISINOPRIL 10 MG PO TABS
10.0000 mg | ORAL_TABLET | Freq: Every day | ORAL | 0 refills | Status: DC
  Filled 2022-11-20: qty 90, 90d supply, fill #0

## 2022-11-20 MED ORDER — FLUOCINONIDE 0.05 % EX SOLN
CUTANEOUS | 0 refills | Status: DC
  Filled 2022-11-20: qty 60, 30d supply, fill #0

## 2022-11-20 MED ORDER — TRAZODONE HCL 50 MG PO TABS
ORAL_TABLET | ORAL | 0 refills | Status: DC
  Filled 2022-11-20: qty 90, 90d supply, fill #0

## 2022-11-22 ENCOUNTER — Other Ambulatory Visit (HOSPITAL_COMMUNITY): Payer: Self-pay

## 2022-11-23 ENCOUNTER — Other Ambulatory Visit (HOSPITAL_COMMUNITY): Payer: Self-pay

## 2022-11-23 MED ORDER — FLUOCINONIDE 0.05 % EX OINT
TOPICAL_OINTMENT | CUTANEOUS | 0 refills | Status: DC
  Filled 2022-11-23: qty 15, 5d supply, fill #0
  Filled 2022-12-29: qty 15, 7d supply, fill #0

## 2022-11-30 ENCOUNTER — Other Ambulatory Visit: Payer: Self-pay

## 2022-12-04 DIAGNOSIS — L988 Other specified disorders of the skin and subcutaneous tissue: Secondary | ICD-10-CM | POA: Diagnosis not present

## 2022-12-04 DIAGNOSIS — D485 Neoplasm of uncertain behavior of skin: Secondary | ICD-10-CM | POA: Diagnosis not present

## 2022-12-04 DIAGNOSIS — Z85828 Personal history of other malignant neoplasm of skin: Secondary | ICD-10-CM | POA: Diagnosis not present

## 2022-12-06 ENCOUNTER — Other Ambulatory Visit (HOSPITAL_COMMUNITY): Payer: Self-pay

## 2022-12-29 ENCOUNTER — Other Ambulatory Visit (HOSPITAL_COMMUNITY): Payer: Self-pay

## 2023-01-01 ENCOUNTER — Other Ambulatory Visit (HOSPITAL_COMMUNITY): Payer: Self-pay

## 2023-01-01 MED ORDER — PREMPRO 0.3-1.5 MG PO TABS
1.0000 | ORAL_TABLET | Freq: Every day | ORAL | 0 refills | Status: DC
  Filled 2023-01-01: qty 28, 28d supply, fill #0
  Filled 2023-01-15 – 2023-01-23 (×3): qty 28, 28d supply, fill #1
  Filled 2023-02-21: qty 28, 28d supply, fill #2
  Filled 2023-04-02: qty 28, 28d supply, fill #3

## 2023-01-03 ENCOUNTER — Other Ambulatory Visit (HOSPITAL_COMMUNITY): Payer: Self-pay

## 2023-01-03 MED ORDER — PREMPRO 0.3-1.5 MG PO TABS
1.0000 | ORAL_TABLET | Freq: Every day | ORAL | 0 refills | Status: DC
  Filled 2023-04-04: qty 84, 84d supply, fill #0
  Filled 2023-06-23: qty 84, 84d supply, fill #1

## 2023-01-15 ENCOUNTER — Other Ambulatory Visit (HOSPITAL_COMMUNITY): Payer: Self-pay

## 2023-01-17 ENCOUNTER — Other Ambulatory Visit (HOSPITAL_COMMUNITY): Payer: Self-pay

## 2023-01-18 ENCOUNTER — Other Ambulatory Visit: Payer: Self-pay

## 2023-01-23 ENCOUNTER — Other Ambulatory Visit: Payer: Self-pay

## 2023-02-05 ENCOUNTER — Encounter: Payer: Self-pay | Admitting: *Deleted

## 2023-02-11 ENCOUNTER — Ambulatory Visit
Admission: RE | Admit: 2023-02-11 | Discharge: 2023-02-11 | Disposition: A | Discharge status: Home / Self Care | Payer: 59 | Source: Ambulatory Visit | Attending: Internal Medicine | Admitting: Internal Medicine

## 2023-02-11 VITALS — BP 124/77 | HR 80 | Temp 97.7°F | Resp 18

## 2023-02-11 DIAGNOSIS — W5501XA Bitten by cat, initial encounter: Secondary | ICD-10-CM | POA: Diagnosis not present

## 2023-02-11 DIAGNOSIS — S61051A Open bite of right thumb without damage to nail, initial encounter: Secondary | ICD-10-CM | POA: Diagnosis not present

## 2023-02-11 MED ORDER — AMOXICILLIN-POT CLAVULANATE 875-125 MG PO TABS: 1.0000 | ORAL_TABLET | Freq: Two times a day (BID) | ORAL | 0 refills | Status: AC

## 2023-02-11 NOTE — ED Notes (Signed)
Pt reports her cat was recently vaccinated.

## 2023-02-11 NOTE — ED Triage Notes (Signed)
Pt presents with an animal bite, states she was bitten and scratched by her indoor/outdoor cat. Pt states she was preventing the cat from going after a copperhead snake. Pt reports pain to the touch.  Pt presents with thumb and index finger swelling.   Home interventions: Advil.

## 2023-02-11 NOTE — Discharge Instructions (Addendum)
Please take antibiotics as prescribed Please complete the course of antibiotics Continue taking ibuprofen Rest and elevate the right hand As the pain and swelling gets better start range of motion exercises If you notice worsening swelling, pain, duskiness of your finger or copious discharge-please return to urgent care immediately to be reevaluated.

## 2023-02-13 NOTE — ED Provider Notes (Signed)
[facility]    CSN: [id number] Arrival date & time: 02/11/23  1021      History   Chief Complaint Chief Complaint  Patient presents with   Animal Bite   Appointment    HPI Sonya Munoz is a 57 y.o. female comes to urgent care after she was bitten by her cats.  The cat bit her down and index finger of the right hand.  The bite was provoked.  Therefore more scarring to restrain the cat from going after a copperhead snake.  The cat is fully vaccinated.  The patient washed the wounds after the bite.  The right thumb is swollen and erythematous.  Patient is taking Advil at home prior to coming to the urgent care.  No febrile episodes. HPI  Past Medical History:  Diagnosis Date   Anxiety and depression    GERD (gastroesophageal reflux disease)    Insomnia     Patient Active Problem List   Diagnosis Date Noted   Patellofemoral pain syndrome of right knee 11/28/2021   Hamstring tendinitis of left thigh 10/06/2021   Hallux rigidus of right foot 11/04/2020   Right shoulder pain 12/27/2017    Past Surgical History:  Procedure Laterality Date   AUGMENTATION MAMMAPLASTY Bilateral 1998   saline    BREAST SURGERY Bilateral    augmentation   fallopian tube repair     RHINOPLASTY     SHOULDER OPEN ROTATOR CUFF REPAIR Left     OB History   No obstetric history on file.      Home Medications    Prior to Admission medications   Medication Sig Start Date End Date Taking? Authorizing Provider  amoxicillin-clavulanate (AUGMENTIN) 875-125 MG tablet Take 1 tablet by mouth every 12 (twelve) hours for 5 days. 02/11/23 02/16/23 Yes Jareli Highland, Britta Mccreedy, MD  COVID-19 mRNA bivalent vaccine, Pfizer, injection Inject into the muscle. 07/25/21   Judyann Munson, MD  estrogen, conjugated,-medroxyprogesterone (PREMPRO) 0.3-1.5 MG tablet Take 1 tablet by mouth daily. 01/01/23     estrogen, conjugated,-medroxyprogesterone (PREMPRO) 0.3-1.5 MG tablet Take 1 tablet by mouth daily.  01/03/23     fluocinonide (LIDEX) 0.05 % external solution Apply topically to scalp as needed 01/18/22     fluocinonide (LIDEX) 0.05 % external solution Apply to scalp as needed. 07/17/22     fluocinonide (LIDEX) 0.05 % external solution Apply to scalp as needed 11/20/22     fluocinonide ointment (LIDEX) 0.05 % Apply externally to elbows as needed 11/23/22     influenza vac split quadrivalent PF (FLUARIX) 0.5 ML injection Inject into the muscle. 07/25/21   Judyann Munson, MD  lisinopril (ZESTRIL) 10 MG tablet Take 1 tablet (10 mg total) by mouth daily. 11/20/22     omeprazole (PRILOSEC) 20 MG capsule Take 20 mg by mouth daily.    [provider]  sertraline (ZOLOFT) 50 MG tablet TAKE 1 TABLET BY MOUTH ONCE DAILY FOR STRESS/MENOPAUSE 07/13/20 07/13/21  Jarrett Soho, PA-C  sertraline (ZOLOFT) 50 MG tablet TAKE 1 TABLET BY MOUTH ONCE DAILY FOR STRESS/MENOPAUSE 01/16/22     sertraline (ZOLOFT) 50 MG tablet Take 1 tablet (50 mg total) by mouth daily for stress/menopause. 07/17/22     traZODone (DESYREL) 50 MG tablet Take 1/2-1 tablet by mouth at bedtime as needed for insomnia 07/20/21     traZODone (DESYREL) 50 MG tablet Take 1/2 - 1 tablet by mouth at bedtime as needed for insomnia 02/28/22     traZODone (DESYREL) 50 MG tablet Take  0.5-1 tablets (25-50 mg total) by mouth at bedtime as needed for insomnia. 11/20/22     triamcinolone cream (KENALOG) 0.1 % Apply 1 application topically 2 (two) times daily. 09/05/18   Zachery Dauer, NP  Zoster Vaccine Adjuvanted [facility]) injection Inject into the muscle. 07/14/22   Judyann Munson, MD    Family History History reviewed. No pertinent family history.  Social History Social History   Tobacco Use   Smoking status: Never   Smokeless tobacco: Never  Substance Use Topics   Alcohol use: Yes     Allergies   Patient has no known allergies.   Review of Systems Review of Systems As per HPI  Physical Exam Triage Vital Signs ED Triage Vitals   Enc Vitals Group     BP 02/11/23 1041 124/77     Pulse Rate 02/11/23 1041 80     Resp 02/11/23 1040 18     Temp 02/11/23 1041 97.7 F (36.5 C)     Temp Source 02/11/23 1040 Oral     SpO2 02/11/23 1041 99 %     Weight --      Height --      Head Circumference --      Peak Flow --      Pain Score 02/11/23 1038 5     Pain Loc --      Pain Edu? --      Excl. in GC? --    No data found.  Updated Vital Signs BP 124/77 (BP Location: Right Arm)   Pulse 80   Temp 97.7 F (36.5 C) (Oral)   Resp 18   SpO2 99%   Visual Acuity Right Eye Distance:   Left Eye Distance:   Bilateral Distance:    Right Eye Near:   Left Eye Near:    Bilateral Near:     Physical Exam Vitals and nursing note reviewed.  Constitutional:      Appearance: Normal appearance.  HENT:     Right Ear: Tympanic membrane normal.     Left Ear: Tympanic membrane normal.  Eyes:     Pupils: Pupils are equal, round, and reactive to light.  Cardiovascular:     Rate and Rhythm: Normal rate and regular rhythm.  Musculoskeletal:        General: Swelling, tenderness, deformity and signs of injury present. Normal range of motion.  Skin:    General: Skin is warm.     Capillary Refill: Capillary refill takes less than 2 seconds.     Findings: Erythema present.  Neurological:     Mental Status: She is alert.      UC Treatments / Results  Labs (all labs ordered are listed, but only abnormal results are displayed) Labs Reviewed - No data to display  EKG   Radiology No results found.  Procedures Procedures (including critical care time)  Medications Ordered in UC Medications - No data to display  Initial Impression / Assessment and Plan / UC Course  I have reviewed the triage vital signs and the nursing notes.  Pertinent labs & imaging results that were available during my care of the patient were reviewed by me and considered in my medical decision making (see chart for details).     1.  Cat bite  of the right thumb: Cat is fully vaccinated Augmentin 875-125 mg twice daily for 5 days Ibuprofen as needed for pain Return precautions given Patient is advised to use cool compresses to help with swelling. Final Clinical Impressions(s) /  UC Diagnoses   Final diagnoses:  Cat bite of right thumb, initial encounter     Discharge Instructions      Please take antibiotics as prescribed Please complete the course of antibiotics Continue taking ibuprofen Rest and elevate the right hand As the pain and swelling gets better start range of motion exercises If you notice worsening swelling, pain, duskiness of your finger or copious discharge-please return to urgent care immediately to be reevaluated.   ED Prescriptions     Medication Sig Dispense Auth. Provider   amoxicillin-clavulanate (AUGMENTIN) 875-125 MG tablet Take 1 tablet by mouth every 12 (twelve) hours for 5 days. 10 tablet Crescencio Jozwiak, Britta Mccreedy, MD      PDMP not reviewed this encounter.   Merrilee Jansky, MD 02/13/23 2240

## 2023-02-19 ENCOUNTER — Other Ambulatory Visit (HOSPITAL_COMMUNITY): Payer: Self-pay

## 2023-02-19 MED ORDER — TRAZODONE HCL 50 MG PO TABS
ORAL_TABLET | ORAL | 0 refills | Status: DC
  Filled 2023-02-19: qty 90, 90d supply, fill #0

## 2023-02-19 MED ORDER — LISINOPRIL 10 MG PO TABS
10.0000 mg | ORAL_TABLET | Freq: Every day | ORAL | 0 refills | Status: DC
  Filled 2023-02-19: qty 90, 90d supply, fill #0

## 2023-02-21 ENCOUNTER — Other Ambulatory Visit (HOSPITAL_COMMUNITY): Payer: Self-pay

## 2023-03-14 DIAGNOSIS — N951 Menopausal and female climacteric states: Secondary | ICD-10-CM | POA: Diagnosis not present

## 2023-03-14 DIAGNOSIS — Z0000 Encounter for general adult medical examination without abnormal findings: Secondary | ICD-10-CM | POA: Diagnosis not present

## 2023-03-14 DIAGNOSIS — I10 Essential (primary) hypertension: Secondary | ICD-10-CM | POA: Diagnosis not present

## 2023-03-14 DIAGNOSIS — E7800 Pure hypercholesterolemia, unspecified: Secondary | ICD-10-CM | POA: Diagnosis not present

## 2023-03-14 DIAGNOSIS — F418 Other specified anxiety disorders: Secondary | ICD-10-CM | POA: Diagnosis not present

## 2023-03-14 DIAGNOSIS — Z6822 Body mass index (BMI) 22.0-22.9, adult: Secondary | ICD-10-CM | POA: Diagnosis not present

## 2023-03-14 DIAGNOSIS — G4709 Other insomnia: Secondary | ICD-10-CM | POA: Diagnosis not present

## 2023-04-02 ENCOUNTER — Other Ambulatory Visit (HOSPITAL_COMMUNITY): Payer: Self-pay

## 2023-04-02 ENCOUNTER — Other Ambulatory Visit (HOSPITAL_BASED_OUTPATIENT_CLINIC_OR_DEPARTMENT_OTHER): Payer: Self-pay | Admitting: Family Medicine

## 2023-04-02 DIAGNOSIS — E7800 Pure hypercholesterolemia, unspecified: Secondary | ICD-10-CM

## 2023-04-03 ENCOUNTER — Encounter (INDEPENDENT_AMBULATORY_CARE_PROVIDER_SITE_OTHER): Payer: Self-pay

## 2023-04-04 ENCOUNTER — Other Ambulatory Visit (HOSPITAL_COMMUNITY): Payer: Self-pay

## 2023-04-04 DIAGNOSIS — R002 Palpitations: Secondary | ICD-10-CM | POA: Diagnosis not present

## 2023-04-17 ENCOUNTER — Encounter (HOSPITAL_BASED_OUTPATIENT_CLINIC_OR_DEPARTMENT_OTHER): Payer: Self-pay

## 2023-04-17 DIAGNOSIS — G4700 Insomnia, unspecified: Secondary | ICD-10-CM | POA: Insufficient documentation

## 2023-04-17 DIAGNOSIS — K219 Gastro-esophageal reflux disease without esophagitis: Secondary | ICD-10-CM

## 2023-04-17 DIAGNOSIS — E7800 Pure hypercholesterolemia, unspecified: Secondary | ICD-10-CM

## 2023-04-17 DIAGNOSIS — I10 Essential (primary) hypertension: Secondary | ICD-10-CM | POA: Insufficient documentation

## 2023-04-17 DIAGNOSIS — Z780 Asymptomatic menopausal state: Secondary | ICD-10-CM

## 2023-04-19 ENCOUNTER — Encounter (HOSPITAL_BASED_OUTPATIENT_CLINIC_OR_DEPARTMENT_OTHER): Payer: Self-pay | Admitting: Cardiology

## 2023-04-19 ENCOUNTER — Ambulatory Visit (INDEPENDENT_AMBULATORY_CARE_PROVIDER_SITE_OTHER): Payer: 59 | Admitting: Cardiology

## 2023-04-19 VITALS — BP 130/80 | HR 74 | Ht 67.0 in | Wt 138.0 lb

## 2023-04-19 DIAGNOSIS — R002 Palpitations: Secondary | ICD-10-CM | POA: Diagnosis not present

## 2023-04-19 DIAGNOSIS — I10 Essential (primary) hypertension: Secondary | ICD-10-CM

## 2023-04-19 DIAGNOSIS — Z7189 Other specified counseling: Secondary | ICD-10-CM

## 2023-04-19 NOTE — Patient Instructions (Addendum)
Medication Instructions:  Continue current medications  *If you need a refill on your cardiac medications before your next appointment, please call your pharmacy*   Lab Work: None Ordered   Testing/Procedures: Your physician has recommended that you wear a 2 weeks Holter monitor. Holter monitors are medical devices that record the heart's electrical activity. Doctors most often use these monitors to diagnose arrhythmias. Arrhythmias are problems with the speed or rhythm of the heartbeat. The monitor is a small, portable device. You can wear one while you do your normal daily activities. This is usually used to diagnose what is causing palpitations/syncope (passing out).    Follow-Up: At [facility], you and your health needs are our priority.  As part of our continuing mission to provide you with exceptional heart care, we have created designated Provider Care Teams.  These Care Teams include your primary Cardiologist (physician) and Advanced Practice Providers (APPs -  Physician Assistants and Nurse Practitioners) who all work together to provide you with the care you need, when you need it.  We recommend signing up for the patient portal called "MyChart".  Sign up information is provided on this After Visit Summary.  MyChart is used to connect with patients for Virtual Visits (Telemedicine).  Patients are able to view lab/test results, encounter notes, upcoming appointments, etc.  Non-urgent messages can be sent to your provider as well.   To learn more about what you can do with MyChart, go to [email or web address].    Your next appointment:    To be determined based on monitor results  ZIO XT- Long Term Monitor Instructions  Your physician has requested you wear a ZIO patch monitor for 14 days.  This is a single patch monitor. [employer] supplies one patch monitor per enrollment. Additional stickers are not available. Please do not apply patch if you will be having a Nuclear  Stress Test,  Echocardiogram, Cardiac CT, MRI, or Chest Xray during the period you would be wearing the  monitor. The patch cannot be worn during these tests. You cannot remove and re-apply the  ZIO XT patch monitor.  Your ZIO patch monitor will be mailed 3 day USPS to your address on file. It may take 3-5 days  to receive your monitor after you have been enrolled.  Once you have received your monitor, please review the enclosed instructions. Your monitor  has already been registered assigning a specific monitor serial # to you.  Billing and Patient Assistance Program Information  We have supplied [employer] with any of your insurance information on file for billing purposes. [employer] offers a sliding scale Patient Assistance Program for patients that do not have  insurance, or whose insurance does not completely cover the cost of the ZIO monitor.  You must apply for the Patient Assistance Program to qualify for this discounted rate.  To apply, please call [employer] at [phone], select option 4, select option 2, ask to apply for  Patient Assistance Program. Meredeth Ide will ask your household income, and how many people  are in your household. They will quote your out-of-pocket cost based on that information.  [employer] will also be able to set up a 58-month, interest-free payment plan if needed.  Applying the monitor   Shave hair from upper left chest.  Hold abrader disc by orange tab. Rub abrader in 40 strokes over the upper left chest as  indicated in your monitor instructions.  Clean area with 4 enclosed alcohol pads. Let dry.  Apply patch as indicated  in monitor instructions. Patch will be placed under collarbone on left  side of chest with arrow pointing upward.  Rub patch adhesive wings for 2 minutes. Remove white label marked "1". Remove the white  label marked "2". Rub patch adhesive wings for 2 additional minutes.  While looking in a mirror, press and release button in center of  patch. A small green light will  flash 3-4 times. This will be your only indicator that the monitor has been turned on.  Do not shower for the first 24 hours. You may shower after the first 24 hours.  Press the button if you feel a symptom. You will hear a small click. Record Date, Time and  Symptom in the Patient Logbook.  When you are ready to remove the patch, follow instructions on the last 2 pages of Patient  Logbook. Stick patch monitor onto the last page of Patient Logbook.  Place Patient Logbook in the blue and white box. Use locking tab on box and tape box closed  securely. The blue and white box has prepaid postage on it. Please place it in the mailbox as  soon as possible. Your physician should have your test results approximately 7 days after the  monitor has been mailed back to [facility].  Call [facility] Customer Care at [phone] if you have questions regarding  your ZIO XT patch monitor. Call them immediately if you see an orange light blinking on your  monitor.  If your monitor falls off in less than 4 days, contact our Monitor department at [phone].  If your monitor becomes loose or falls off after 4 days call [employer] at [phone] for  suggestions on securing your monitor   Recent data, summarized from NPR from a study from the [facility]:  CropWizard.com.pt  "Researchers at the [facility] set out to answer this question by comparing statins to supplements in a clinical trial. They tracked the outcomes of 190 adults, ages 19 to 44. Some participants were given a 5 mg daily dose of rosuvastatin, a statin that is sold under the brand name Crestor for 28 days. Others were given supplements, including fish oil, cinnamon, garlic, turmeric, plant sterols or red yeast rice for the same period."  "What we found was that  rosuvastatin lowered LDL cholesterol by almost 23% and that was vastly superior to placebo and any of the six supplements studied in the trial," study Kayren Eaves, M.D. of the [facility] told NPR. He says this level of reduction is enough to lower the risk of heart attacks and strokes. The findings are published in the Journal of the [employer] of Cardiology.  "Oftentimes these supplements are marketed as 'natural ways' to lower your cholesterol," says Laffin. But he says none of the dietary supplements demonstrated any significant decrease in LDL cholesterol compared with a placebo. LDL cholesterol is considered the 'bad cholesterol' because it can contribute to plaque build-up in the artery walls - which can narrow the arteries, and set the stage for heart attacks and strokes"

## 2023-04-19 NOTE — Progress Notes (Signed)
Cardiology Office Note:  .    Date:  04/19/2023  ID:  Sonya Munoz, DOB [birth year], MRN [id number] PCP: Jarrett Soho, PA-C  [facility] Providers Cardiologist:  Jodelle Red, MD     History of Present Illness: .    Sonya Munoz is a 57 y.o. female with a hx of hypertension, GERD, depression, anxiety, here for the evaluation of palpitations. Referral notes from Jarrett Soho, PA-C personally reviewed. At their visit 04/04/2023, she reported palpitations occurring about 10 times a day for a week.   Tachycardia/palpitations: -Initial onset: She affirms that she has been aware of intermittent palpitations throughout her life. -Frequency/Duration: However, for a solid week she was experiencing some sort of ectopy or pauses multiple times an hour. She notes that she had 6-7 episodes in the 20-minute drive to her PCP's office. At their peak, her palpitations were occurring maybe 10x an hour lasting for 1-2 beats at most (not stacking). Lately her palpitations have calmed down significantly. Yesterday she was aware of a couple palpitations, and maybe 1-2 episodes this morning. -Associated symptoms:  A couple episodes of substernal chest pain; the chest pain was very fleeting, maybe lasting 5 seconds when it occurred. -Aggravating/alleviating factors: She confirms that her palpitations are not stress related, do not wake her up at night. During the week of her more severe palpitations, she had been sleeping and exercising as usual. More recently she flew to [place] to be with her father as he was struggling with health issues. At that time she hadn't been sleeping or eating normally, and was very stressed. However, she hadn't experienced the same severity of palpitations as she did several weeks ago. -Syncope/near syncope:  No loss of consciousness. -Prior cardiac history:  None -Prior ECG: From PCP visit, personally reviewed. -Prior workup:  Lab work has  reportedly been within normal limits.  -Caffeine:  Drinks 1 cup of tea a day, no recent caffeine changes. -OTC supplements:  Cinnamon in her shake every day. Also she has been taking red yeast rice with subsequent improvement in her cholesterol levels. She is determined not to be on statin therapy. -Comorbidities:  Hypertension - She has been on lisinopril since she was 57 yo. -Exercise level:  Works as a Engineer, civil (consulting). Works out 4-5 days a week. -Current diet:  Completes meal preparation for the week.  -Labs: TSH, kidney function/electrolytes, CBC reviewed. -Cardiac ROS: She denies any shortness of breath, peripheral edema, lightheadedness, headaches, syncope, orthopnea, or PND. -Family history:  Her father had a TAVR, prior carotid endarterectomy with perioperative stroke, was a smoker and developed COPD. Her mother is 3 yo with hyperlipidemia. She has one younger sister with hyperlipidemia. Her grandmother lived to 28 yo.  ROS:  Please see the history of present illness. ROS otherwise negative except as noted.  (+) Palpitations (+) Substernal chest pain  Studies Reviewed: Marland Kitchen       ECG from 04/04/23: SR at 67 bpm      Physical Exam:    VS:  BP 130/80   Pulse 74   Ht 5\' 7"  (1.702 m)   Wt 138 lb (62.6 kg)   BMI 21.61 kg/m    Wt Readings from Last 3 Encounters:  04/19/23 138 lb (62.6 kg)  03/28/22 137 lb (62.1 kg)  03/01/22 137 lb (62.1 kg)    GEN: Well nourished, well developed in no acute distress HEENT: Normal, moist mucous membranes NECK: No JVD CARDIAC: regular rhythm, normal S1 and S2, no rubs  or gallops. No murmur. VASCULAR: Radial and DP pulses 2+ bilaterally. No carotid bruits RESPIRATORY:  Clear to auscultation without rales, wheezing or rhonchi  ABDOMEN: Soft, non-tender, non-distended MUSCULOSKELETAL:  Ambulates independently SKIN: Warm and dry, no edema NEUROLOGIC:  Alert and oriented x 3. No focal neuro deficits noted. PSYCHIATRIC:  Normal affect   ASSESSMENT AND PLAN:  .    Palpitations  -will order 2 week Zio for further evaluation. If abnormal, would pursue echo -reviewed red flag warning signs that need immediate medical attention  Hypertension -at goal, continue lisinopril  CV risk counseling and prevention -recommend heart healthy/Mediterranean diet, with whole grains, fruits, vegetable, fish, lean meats, nuts, and olive oil. Limit salt. -recommend moderate walking, 3-5 times/week for 30-50 minutes each session. Aim for at least 150 minutes.week. Goal should be pace of 3 miles/hours, or walking 1.5 miles in 30 minutes -recommend avoidance of tobacco products. Avoid excess alcohol. -ASCVD risk score: The 10-year ASCVD risk score (Arnett DK, et al., 2019) is: 3.5%   Values used to calculate the score:     Age: 53 years     Sex: Female     Is Non-Hispanic African American: No     Diabetic: No     Tobacco smoker: No     Systolic Blood Pressure: 130 mmHg     Is BP treated: Yes     HDL Cholesterol: 78 mg/dL     Total Cholesterol: 252 mg/dL    Dispo: Follow-up TBD based on results of testing, or sooner as needed.  I,Mathew Stumpf,acting as a Neurosurgeon for [employer], MD.,have documented all relevant documentation on the behalf of Jodelle Red, MD,as directed by  Jodelle Red, MD while in the presence of Jodelle Red, MD.  I, Jodelle Red, MD, have reviewed all documentation for this visit. The documentation on 04/19/23 for the exam, diagnosis, procedures, and orders are all accurate and complete.   Signed, Jodelle Red, MD

## 2023-04-20 ENCOUNTER — Ambulatory Visit: Payer: 59 | Attending: Cardiology

## 2023-04-20 DIAGNOSIS — R002 Palpitations: Secondary | ICD-10-CM

## 2023-04-20 NOTE — Progress Notes (Unsigned)
Enrolled patient for a 14 day Zio XT  monitor to be mailed to patients home  °

## 2023-04-23 ENCOUNTER — Ambulatory Visit (HOSPITAL_BASED_OUTPATIENT_CLINIC_OR_DEPARTMENT_OTHER)
Admission: RE | Admit: 2023-04-23 | Discharge: 2023-04-23 | Disposition: A | Discharge status: Home / Self Care | Payer: Self-pay | Source: Ambulatory Visit | Attending: Family Medicine | Admitting: Family Medicine

## 2023-04-23 ENCOUNTER — Encounter (HOSPITAL_BASED_OUTPATIENT_CLINIC_OR_DEPARTMENT_OTHER): Payer: Self-pay

## 2023-04-23 DIAGNOSIS — E7800 Pure hypercholesterolemia, unspecified: Secondary | ICD-10-CM | POA: Insufficient documentation

## 2023-04-25 ENCOUNTER — Other Ambulatory Visit (HOSPITAL_COMMUNITY): Payer: Self-pay

## 2023-05-03 ENCOUNTER — Other Ambulatory Visit (HOSPITAL_COMMUNITY): Payer: Self-pay

## 2023-05-03 MED ORDER — ROSUVASTATIN CALCIUM 5 MG PO TABS
5.0000 mg | ORAL_TABLET | Freq: Every day | ORAL | 0 refills | Status: DC
  Filled 2023-05-03: qty 90, 90d supply, fill #0

## 2023-05-15 DIAGNOSIS — R002 Palpitations: Secondary | ICD-10-CM | POA: Diagnosis not present

## 2023-06-04 ENCOUNTER — Other Ambulatory Visit (HOSPITAL_COMMUNITY): Payer: Self-pay

## 2023-06-04 MED ORDER — LISINOPRIL 10 MG PO TABS
10.0000 mg | ORAL_TABLET | Freq: Every day | ORAL | 1 refills | Status: DC
  Filled 2023-06-04: qty 90, 90d supply, fill #0
  Filled 2023-08-26: qty 90, 90d supply, fill #1

## 2023-06-08 ENCOUNTER — Ambulatory Visit (HOSPITAL_BASED_OUTPATIENT_CLINIC_OR_DEPARTMENT_OTHER): Payer: 59 | Admitting: Cardiology

## 2023-06-23 ENCOUNTER — Other Ambulatory Visit (HOSPITAL_COMMUNITY): Payer: Self-pay

## 2023-06-26 ENCOUNTER — Encounter (HOSPITAL_BASED_OUTPATIENT_CLINIC_OR_DEPARTMENT_OTHER): Payer: Self-pay

## 2023-06-26 ENCOUNTER — Other Ambulatory Visit (HOSPITAL_COMMUNITY): Payer: Self-pay

## 2023-06-26 DIAGNOSIS — I471 Supraventricular tachycardia, unspecified: Secondary | ICD-10-CM

## 2023-06-26 MED ORDER — PREMPRO 0.3-1.5 MG PO TABS
1.0000 | ORAL_TABLET | Freq: Every day | ORAL | 2 refills | Status: DC
  Filled 2023-06-26: qty 84, 84d supply, fill #0
  Filled 2023-09-15: qty 28, 28d supply, fill #1
  Filled 2023-10-13: qty 84, 84d supply, fill #2
  Filled 2024-01-04: qty 56, 56d supply, fill #3
  Filled 2024-02-23: qty 18, 18d supply, fill #4

## 2023-06-26 NOTE — Telephone Encounter (Signed)
Are one of you able to look at this monitor result, looks like report was entered in 7/23?

## 2023-06-28 NOTE — Telephone Encounter (Signed)
Please assist to schedule for echo

## 2023-07-12 ENCOUNTER — Other Ambulatory Visit (HOSPITAL_BASED_OUTPATIENT_CLINIC_OR_DEPARTMENT_OTHER): Payer: Self-pay

## 2023-07-12 MED ORDER — INFLUENZA VIRUS VACC SPLIT PF (FLUZONE) 0.5 ML IM SUSY
0.5000 mL | PREFILLED_SYRINGE | Freq: Once | INTRAMUSCULAR | 0 refills | Status: AC
  Filled 2023-07-12: qty 0.5, 1d supply, fill #0

## 2023-07-12 MED ORDER — COVID-19 MRNA VAC-TRIS(PFIZER) 30 MCG/0.3ML IM SUSY
0.3000 mL | PREFILLED_SYRINGE | Freq: Once | INTRAMUSCULAR | 0 refills | Status: AC
  Filled 2023-07-12: qty 0.3, 1d supply, fill #0

## 2023-07-22 ENCOUNTER — Other Ambulatory Visit (HOSPITAL_COMMUNITY): Payer: Self-pay

## 2023-07-23 ENCOUNTER — Other Ambulatory Visit (HOSPITAL_COMMUNITY): Payer: Self-pay

## 2023-07-23 MED ORDER — SERTRALINE HCL 50 MG PO TABS
50.0000 mg | ORAL_TABLET | Freq: Every day | ORAL | 3 refills | Status: DC
  Filled 2023-07-23: qty 90, 90d supply, fill #0
  Filled 2023-10-13: qty 90, 90d supply, fill #1
  Filled 2024-01-25: qty 90, 90d supply, fill #2
  Filled 2024-04-26: qty 90, 90d supply, fill #3

## 2023-07-23 MED ORDER — ROSUVASTATIN CALCIUM 5 MG PO TABS
5.0000 mg | ORAL_TABLET | Freq: Every day | ORAL | 0 refills | Status: DC
  Filled 2023-07-23: qty 90, 90d supply, fill #0

## 2023-07-24 ENCOUNTER — Other Ambulatory Visit (HOSPITAL_COMMUNITY): Payer: Self-pay

## 2023-07-25 ENCOUNTER — Ambulatory Visit (HOSPITAL_BASED_OUTPATIENT_CLINIC_OR_DEPARTMENT_OTHER): Payer: 59

## 2023-07-25 DIAGNOSIS — I471 Supraventricular tachycardia, unspecified: Secondary | ICD-10-CM | POA: Diagnosis not present

## 2023-07-25 LAB — ECHOCARDIOGRAM COMPLETE
AR max vel: 2.76 cm2
AV Area VTI: 2.78 cm2
AV Area mean vel: 2.59 cm2
AV Mean grad: 4 mm[Hg]
AV Peak grad: 8 mm[Hg]
Ao pk vel: 1.41 m/s
Area-P 1/2: 3.74 cm2
S' Lateral: 2.47 cm

## 2023-07-31 DIAGNOSIS — E7800 Pure hypercholesterolemia, unspecified: Secondary | ICD-10-CM | POA: Diagnosis not present

## 2023-08-26 ENCOUNTER — Other Ambulatory Visit (HOSPITAL_COMMUNITY): Payer: Self-pay

## 2023-08-27 ENCOUNTER — Other Ambulatory Visit: Payer: Self-pay

## 2023-08-28 ENCOUNTER — Other Ambulatory Visit (HOSPITAL_COMMUNITY): Payer: Self-pay

## 2023-08-28 MED ORDER — TRAZODONE HCL 50 MG PO TABS
25.0000 mg | ORAL_TABLET | Freq: Every evening | ORAL | 1 refills | Status: DC | PRN
  Filled 2023-08-28: qty 90, 90d supply, fill #0
  Filled 2024-02-23: qty 90, 90d supply, fill #1

## 2023-09-15 ENCOUNTER — Other Ambulatory Visit (HOSPITAL_COMMUNITY): Payer: Self-pay

## 2023-09-17 ENCOUNTER — Other Ambulatory Visit: Payer: Self-pay

## 2023-09-18 ENCOUNTER — Other Ambulatory Visit (HOSPITAL_COMMUNITY): Payer: Self-pay

## 2023-10-05 ENCOUNTER — Encounter: Payer: Self-pay | Admitting: Plastic Surgery

## 2023-10-05 ENCOUNTER — Ambulatory Visit (INDEPENDENT_AMBULATORY_CARE_PROVIDER_SITE_OTHER): Payer: Self-pay | Admitting: Plastic Surgery

## 2023-10-05 VITALS — BP 135/70 | HR 73 | Ht 67.0 in | Wt 137.8 lb

## 2023-10-05 DIAGNOSIS — Z719 Counseling, unspecified: Secondary | ICD-10-CM

## 2023-10-05 NOTE — Progress Notes (Signed)
   Subjective:    Patient ID: Sonya Munoz, female    DOB: 1966-09-01, 57 y.o.   MRN: [id number]  The patient is a 57 year old female here for evaluation of her face.  She has had some minor procedures in the past for overall facial rejuvenation.  She is also had a rhinoplasty and a genioplasty.  She is interested in improving her mandibular cervical angle.  She is 5 feet 7 inches tall weighs 137 pounds.  Overall she has really good skin and is done a great job taking care of herself.      Review of Systems  Constitutional: Negative.   Eyes: Negative.   Respiratory: Negative.    Cardiovascular: Negative.   Endocrine: Negative.   Genitourinary: Negative.        Objective:   Physical Exam Vitals reviewed.  Constitutional:      Appearance: Normal appearance.  HENT:     Head: Normocephalic and atraumatic.  Skin:    General: Skin is warm.     Capillary Refill: Capillary refill takes less than 2 seconds.  Neurological:     Mental Status: She is alert and oriented to person, place, and time.  Psychiatric:        Mood and Affect: Mood normal.        Behavior: Behavior normal.        Thought Content: Thought content normal.        Judgment: Judgment normal.        Assessment & Plan:     ICD-10-CM   1. Encounter for counseling  Z71.9       Pictures were obtained of the patient and placed in the chart with the patient's or guardian's permission.  We discussed the options of Kybella, cool sculpting, laser and liposuction.  Patient would like more information about the [place] and halo.

## 2023-10-13 ENCOUNTER — Other Ambulatory Visit (HOSPITAL_COMMUNITY): Payer: Self-pay

## 2023-10-15 ENCOUNTER — Other Ambulatory Visit: Payer: Self-pay

## 2023-10-15 ENCOUNTER — Other Ambulatory Visit (HOSPITAL_COMMUNITY): Payer: Self-pay

## 2023-10-15 MED ORDER — ROSUVASTATIN CALCIUM 5 MG PO TABS
5.0000 mg | ORAL_TABLET | Freq: Every day | ORAL | 2 refills | Status: DC
  Filled 2023-10-15: qty 90, 90d supply, fill #0
  Filled 2024-01-25: qty 90, 90d supply, fill #1
  Filled 2024-04-26: qty 90, 90d supply, fill #2

## 2023-11-13 DIAGNOSIS — L821 Other seborrheic keratosis: Secondary | ICD-10-CM | POA: Diagnosis not present

## 2023-11-13 DIAGNOSIS — D225 Melanocytic nevi of trunk: Secondary | ICD-10-CM | POA: Diagnosis not present

## 2023-11-13 DIAGNOSIS — Z85828 Personal history of other malignant neoplasm of skin: Secondary | ICD-10-CM | POA: Diagnosis not present

## 2023-11-13 DIAGNOSIS — Z419 Encounter for procedure for purposes other than remedying health state, unspecified: Secondary | ICD-10-CM | POA: Diagnosis not present

## 2023-11-13 DIAGNOSIS — L910 Hypertrophic scar: Secondary | ICD-10-CM | POA: Diagnosis not present

## 2023-11-14 ENCOUNTER — Other Ambulatory Visit (HOSPITAL_BASED_OUTPATIENT_CLINIC_OR_DEPARTMENT_OTHER): Payer: Self-pay | Admitting: Family Medicine

## 2023-11-14 DIAGNOSIS — Z1239 Encounter for other screening for malignant neoplasm of breast: Secondary | ICD-10-CM

## 2023-11-15 ENCOUNTER — Telehealth: Payer: Self-pay | Admitting: Plastic Surgery

## 2023-11-15 NOTE — Telephone Encounter (Signed)
Called PT to confirm appt and left a VM

## 2023-11-16 ENCOUNTER — Ambulatory Visit (INDEPENDENT_AMBULATORY_CARE_PROVIDER_SITE_OTHER): Payer: Self-pay | Admitting: Plastic Surgery

## 2023-11-16 DIAGNOSIS — Z719 Counseling, unspecified: Secondary | ICD-10-CM

## 2023-11-16 NOTE — Progress Notes (Signed)
Kybella Procedure Note  Procedure: Cosmetic deoxycholic acid injection (1 vial was used)  Pre-operative Diagnosis: Submental fullness  Post-operative Diagnosis: Same  Complications:  None  Brief history: The patient desires improvement in the appearance of moderate to severe convexity or fullness associated with submental fat. I discussed with the patient this proposed procedure of Kybella, which is customized depending on the particular needs of the patient. It is performed on the submental area for reduction in the fat.  The alternatives were discussed with the patient. The risks were addressed including bleeding, scarring, infection, damage to deeper structures, asymmetry, numbness, formation of areas of hardness, swelling, nodules, skin ulceration, headache, alopecia, difficulty swallowing, and muscle weakness. Additionally, marginal mandibular nerve injury could occur and is manifested as an asymmetric smile or facial muscle weakness.  The individual's choice to undergo a surgical procedure is based on the comparison of risks to potential benefits. Injections do not arrest the aging process or produce permanent tightening of the skin.  Operative intervention maybe necessary to maintain the results. The patient understands and wishes to proceed. An informed consent was signed and informational brochures given to her prior to the procedure.  Procedure: The area was prepped with alcohol and dried with a clean gauze. Using a clean technique, the submental area was palpated and pinched.  The skin at the area was pulled and there was appropriate elasticity noted without excessive skin laxity. The patient was asked to tense the platysma to define the subcutaneous fat between the dermis and platysma.  A skin marker was used to mark the anterior, posterior and lateral borders of the submental fat compartment. The "no treatment Zone" was marked. The treatment zone was injected in the subcutaneous layer with  lidocaine 1% with epinepherine. The grid was placed on the skin with the water over it for activation of the ink.  The clear protective sheet was removed leaving the markings.  The dots outside the previously marked treatment zone were removed with an alcohol wipe.  The pre-platysmal fat was pinched with 2 fingers.  Each dot was injected perpendicular to the skin with .2 cc of Kybella using a 1 ml syring and a 30 gauge needle. The ink was wiped off the skin with an alcohol wipe and a cold pack was applied to the treatment area. No complications were noted. Light pressure with an ice pack was held for 5 minutes. She was instructed explicitly in post-operative care including no massage, heavy activity, work out or facial for 24 hours.  Deoxycholic Acid LOT:  [id number]

## 2023-11-20 ENCOUNTER — Encounter (HOSPITAL_BASED_OUTPATIENT_CLINIC_OR_DEPARTMENT_OTHER): Payer: 59 | Admitting: Radiology

## 2023-11-20 DIAGNOSIS — Z1231 Encounter for screening mammogram for malignant neoplasm of breast: Secondary | ICD-10-CM

## 2023-11-27 ENCOUNTER — Other Ambulatory Visit (HOSPITAL_COMMUNITY): Payer: Self-pay

## 2023-11-27 MED ORDER — LISINOPRIL 10 MG PO TABS
10.0000 mg | ORAL_TABLET | Freq: Every day | ORAL | 0 refills | Status: DC
  Filled 2023-11-27: qty 90, 90d supply, fill #0

## 2023-11-30 ENCOUNTER — Encounter: Payer: 59 | Admitting: Plastic Surgery

## 2023-12-05 ENCOUNTER — Ambulatory Visit (HOSPITAL_BASED_OUTPATIENT_CLINIC_OR_DEPARTMENT_OTHER)
Admission: RE | Admit: 2023-12-05 | Discharge: 2023-12-05 | Disposition: A | Discharge status: Home / Self Care | Payer: 59 | Source: Ambulatory Visit | Attending: Family Medicine | Admitting: Family Medicine

## 2023-12-05 ENCOUNTER — Encounter (HOSPITAL_BASED_OUTPATIENT_CLINIC_OR_DEPARTMENT_OTHER): Payer: Self-pay | Admitting: Radiology

## 2023-12-05 DIAGNOSIS — Z1231 Encounter for screening mammogram for malignant neoplasm of breast: Secondary | ICD-10-CM | POA: Insufficient documentation

## 2023-12-05 DIAGNOSIS — Z1239 Encounter for other screening for malignant neoplasm of breast: Secondary | ICD-10-CM

## 2023-12-17 DIAGNOSIS — H524 Presbyopia: Secondary | ICD-10-CM | POA: Diagnosis not present

## 2024-01-01 ENCOUNTER — Ambulatory Visit (INDEPENDENT_AMBULATORY_CARE_PROVIDER_SITE_OTHER): Payer: Self-pay | Admitting: Plastic Surgery

## 2024-01-01 DIAGNOSIS — Z719 Counseling, unspecified: Secondary | ICD-10-CM

## 2024-01-01 NOTE — Progress Notes (Signed)
 The patient is a 58 year old female here for follow-up after undergoing Kybella 6 weeks ago.  Overall she has done really well.  There is no sign of infection.  There does seem to be some improvement.  She will likely want to do another 1 but we will plan for end of April.  Pictures were obtained of the patient and placed in the chart with the patient's or guardian's permission.

## 2024-01-04 ENCOUNTER — Other Ambulatory Visit (HOSPITAL_COMMUNITY): Payer: Self-pay

## 2024-01-05 ENCOUNTER — Other Ambulatory Visit (HOSPITAL_COMMUNITY): Payer: Self-pay

## 2024-02-23 ENCOUNTER — Other Ambulatory Visit (HOSPITAL_COMMUNITY): Payer: Self-pay

## 2024-02-25 ENCOUNTER — Other Ambulatory Visit: Payer: Self-pay

## 2024-02-25 ENCOUNTER — Other Ambulatory Visit (HOSPITAL_COMMUNITY): Payer: Self-pay

## 2024-02-25 MED ORDER — PREMPRO 0.3-1.5 MG PO TABS
1.0000 | ORAL_TABLET | Freq: Every day | ORAL | 0 refills | Status: DC
  Filled 2024-02-25: qty 84, 84d supply, fill #0
  Filled 2024-02-26: qty 28, 28d supply, fill #0
  Filled 2024-04-04: qty 28, 28d supply, fill #1
  Filled 2024-05-01: qty 28, 28d supply, fill #2
  Filled 2024-05-25: qty 28, 28d supply, fill #3
  Filled ????-??-??: fill #1

## 2024-02-25 MED ORDER — LISINOPRIL 10 MG PO TABS
10.0000 mg | ORAL_TABLET | Freq: Every day | ORAL | 0 refills | Status: DC
  Filled 2024-02-25: qty 90, 90d supply, fill #0

## 2024-02-26 ENCOUNTER — Other Ambulatory Visit (HOSPITAL_COMMUNITY): Payer: Self-pay

## 2024-02-29 ENCOUNTER — Ambulatory Visit (INDEPENDENT_AMBULATORY_CARE_PROVIDER_SITE_OTHER): Payer: Self-pay | Admitting: Plastic Surgery

## 2024-02-29 ENCOUNTER — Encounter: Payer: Self-pay | Admitting: Plastic Surgery

## 2024-02-29 DIAGNOSIS — Z719 Counseling, unspecified: Secondary | ICD-10-CM

## 2024-02-29 NOTE — Progress Notes (Signed)
 Kybella Procedure Note  Procedure: Cosmetic deoxycholic acid injection (1 vial was used)  Pre-operative Diagnosis: Submental fullness  Post-operative Diagnosis: Same  Complications:  None  Brief history: The patient desires improvement in the appearance of moderate to severe convexity or fullness associated with submental fat. I discussed with the patient this proposed procedure of Kybella, which is customized depending on the particular needs of the patient. It is performed on the submental area for reduction in the fat.  The alternatives were discussed with the patient. The risks were addressed including bleeding, scarring, infection, damage to deeper structures, asymmetry, numbness, formation of areas of hardness, swelling, nodules, skin ulceration, headache, alopecia, difficulty swallowing, and muscle weakness. Additionally, marginal mandibular nerve injury could occur and is manifested as an asymmetric smile or facial muscle weakness.  The individual's choice to undergo a surgical procedure is based on the comparison of risks to potential benefits. Injections do not arrest the aging process or produce permanent tightening of the skin.  Operative intervention maybe necessary to maintain the results. The patient understands and wishes to proceed. An informed consent was signed and informational brochures given to her prior to the procedure.  Procedure: The area was prepped with alcohol and dried with a clean gauze. Using a clean technique, the submental area was palpated and pinched.  The skin at the area was pulled and there was appropriate elasticity noted without excessive skin laxity.  The pre-platysmal fat was pinched with 2 fingers.  Each dot was injected perpendicular to the skin with .2 cc of Kybella using a 1 ml syringe and a 30 gauge needle. A cold pack was applied to the treatment area. No complications were noted. Light pressure with an ice pack was held for 5 minutes. She was instructed  explicitly in post-operative care including no massage, heavy activity, work out or facial for 24 hours.  Deoxycholic Acid LOT:  [id number]

## 2024-02-29 NOTE — Addendum Note (Signed)
 Addended by: Ali Antonio on: 02/29/2024 09:19 AM   Modules accepted: Orders

## 2024-03-01 ENCOUNTER — Encounter: Payer: Self-pay | Admitting: Plastic Surgery

## 2024-03-27 ENCOUNTER — Encounter: Payer: Self-pay | Admitting: Family Medicine

## 2024-03-27 ENCOUNTER — Ambulatory Visit (INDEPENDENT_AMBULATORY_CARE_PROVIDER_SITE_OTHER): Admitting: Family Medicine

## 2024-03-27 ENCOUNTER — Other Ambulatory Visit: Payer: Self-pay

## 2024-03-27 VITALS — BP 128/82 | HR 77 | Ht 67.0 in | Wt 139.0 lb

## 2024-03-27 DIAGNOSIS — M79651 Pain in right thigh: Secondary | ICD-10-CM | POA: Diagnosis not present

## 2024-03-27 NOTE — Progress Notes (Signed)
   I, Miquel Amen, CMA acting as a scribe for Garlan Juniper, MD.  Sonya Munoz is a 58 y.o. female who presents to [facility] today for R thigh pain. Pt was previously seen by Dr. Dorothey Gate in 2023 for L hamstring tendinopathy.   Today, pt c/o R thigh pain ongoing for about 3 months. Pt locates pain to lateral aspect of the thigh. Some pain during the day. Sx worse at night, throbbing with side-sleeping. See's [facility]) regularly, has gotten some relief with this in the past.   Aggravates: side-lying, squatting Treatments tried: [employer], Tylenol , Advil  Pertinent review of systems: No fevers or chills  Relevant historical information: Hypertension and GERD.   Exam:  BP 128/82   Pulse 77   Ht 5\' 7"  (1.702 m)   Wt 139 lb (63 kg)   SpO2 100%   BMI 21.77 kg/m  General: Well Developed, well nourished, and in no acute distress.   MSK: Right hip normal-appearing Decreased range of motion lacking full external rotation.  Normal flexion. Hip abduction strength diminished abduction and external rotation.     Assessment and Plan: 58 y.o. female with right lateral hip pain due to greater trochanteric bursitis and hip abductor tendinitis.  She is a good candidate for physical therapy.  Plan to refer to PT and work on home exercise program especially working on hip abduction strengthening.  Check back as needed.   PDMP not reviewed this encounter. Orders Placed This Encounter  Procedures   US  LIMITED JOINT SPACE STRUCTURES LOW RIGHT(NO LINKED CHARGES)    Reason for Exam (SYMPTOM  OR DIAGNOSIS REQUIRED):   right thigh pain    Preferred imaging location?:   [facility] referral to Physical Therapy    Referral Priority:   Routine    Referral Type:   Physical Medicine    Referral Reason:   Specialty Services Required    Requested Specialty:   Physical Therapy    Number of Visits Requested:   1    No orders of the defined types were placed in this encounter.    Discussed warning signs or symptoms. Please see discharge instructions. Patient expresses understanding.   The above documentation has been reviewed and is accurate and complete Garlan Juniper, M.D.

## 2024-03-27 NOTE — Patient Instructions (Addendum)
 Thank you for coming in today.   Check out topical Mg12  I've referred you to Physical Therapy.  Let us  know if you don't hear from them in one week.   Please work on the home exercises the athletic trainer went over with you:  View at [email or web address] code [phone]

## 2024-04-01 ENCOUNTER — Ambulatory Visit: Payer: Self-pay | Admitting: Plastic Surgery

## 2024-04-01 DIAGNOSIS — Z719 Counseling, unspecified: Secondary | ICD-10-CM

## 2024-04-01 NOTE — Progress Notes (Signed)
 The patient is a 58 year old female here for follow-up after undergoing Kybella injection twice.  We were able to get some pictures today with her permission and they are in the chart.  Overall she is doing really well.  She is very pleased with the results.  She is going to give it a little more time and come back and see us  in about 6 weeks.  Pictures were obtained of the patient and placed in the chart with the patient's or guardian's permission.

## 2024-04-04 ENCOUNTER — Other Ambulatory Visit (HOSPITAL_COMMUNITY): Payer: Self-pay

## 2024-04-09 DIAGNOSIS — Z0000 Encounter for general adult medical examination without abnormal findings: Secondary | ICD-10-CM | POA: Diagnosis not present

## 2024-04-09 DIAGNOSIS — Z6822 Body mass index (BMI) 22.0-22.9, adult: Secondary | ICD-10-CM | POA: Diagnosis not present

## 2024-04-09 DIAGNOSIS — E7800 Pure hypercholesterolemia, unspecified: Secondary | ICD-10-CM | POA: Diagnosis not present

## 2024-04-09 DIAGNOSIS — N951 Menopausal and female climacteric states: Secondary | ICD-10-CM | POA: Diagnosis not present

## 2024-04-11 ENCOUNTER — Ambulatory Visit: Payer: Self-pay | Admitting: Plastic Surgery

## 2024-04-16 DIAGNOSIS — D3132 Benign neoplasm of left choroid: Secondary | ICD-10-CM | POA: Diagnosis not present

## 2024-04-16 DIAGNOSIS — H43812 Vitreous degeneration, left eye: Secondary | ICD-10-CM | POA: Diagnosis not present

## 2024-05-01 ENCOUNTER — Other Ambulatory Visit (HOSPITAL_COMMUNITY): Payer: Self-pay

## 2024-05-01 ENCOUNTER — Other Ambulatory Visit: Payer: Self-pay

## 2024-05-01 MED ORDER — FLUOCINONIDE 0.05 % EX OINT
1.0000 | TOPICAL_OINTMENT | CUTANEOUS | 1 refills | Status: AC | PRN
  Filled 2024-05-01: qty 15, 30d supply, fill #0

## 2024-05-02 ENCOUNTER — Other Ambulatory Visit: Payer: Self-pay

## 2024-05-02 ENCOUNTER — Other Ambulatory Visit (HOSPITAL_COMMUNITY): Payer: Self-pay

## 2024-05-16 DIAGNOSIS — H43813 Vitreous degeneration, bilateral: Secondary | ICD-10-CM | POA: Diagnosis not present

## 2024-05-25 ENCOUNTER — Other Ambulatory Visit (HOSPITAL_COMMUNITY): Payer: Self-pay

## 2024-05-26 ENCOUNTER — Other Ambulatory Visit: Payer: Self-pay

## 2024-05-26 ENCOUNTER — Other Ambulatory Visit (HOSPITAL_COMMUNITY): Payer: Self-pay

## 2024-05-26 MED ORDER — LISINOPRIL 10 MG PO TABS
10.0000 mg | ORAL_TABLET | Freq: Every day | ORAL | 3 refills | Status: AC
  Filled 2024-05-26: qty 90, 90d supply, fill #0
  Filled 2024-08-22: qty 90, 90d supply, fill #1
  Filled 2024-11-24: qty 90, 90d supply, fill #2

## 2024-05-26 MED ORDER — PREMPRO 0.3-1.5 MG PO TABS
1.0000 | ORAL_TABLET | Freq: Every day | ORAL | 0 refills | Status: DC
  Filled 2024-05-26: qty 84, 84d supply, fill #0
  Filled 2024-08-22: qty 84, 84d supply, fill #1

## 2024-05-28 ENCOUNTER — Ambulatory Visit (HOSPITAL_BASED_OUTPATIENT_CLINIC_OR_DEPARTMENT_OTHER): Admitting: Physical Therapy

## 2024-06-03 ENCOUNTER — Ambulatory Visit (INDEPENDENT_AMBULATORY_CARE_PROVIDER_SITE_OTHER): Payer: Self-pay | Admitting: Plastic Surgery

## 2024-06-03 DIAGNOSIS — Z719 Counseling, unspecified: Secondary | ICD-10-CM

## 2024-06-03 NOTE — Progress Notes (Signed)
 The patient is a 58 year old female here for follow-up after Kybella injections in the submental chin area.  Overall she is pleased with her results.  I think that another set of injections would be beneficial.  She is going to give it some thought and likely do it in October after a couple of trips.  Overall she is doing well.  Pictures were obtained of the patient and placed in the chart with the patient's or guardian's permission.

## 2024-07-22 ENCOUNTER — Other Ambulatory Visit (HOSPITAL_COMMUNITY): Payer: Self-pay

## 2024-07-22 MED ORDER — SERTRALINE HCL 50 MG PO TABS
50.0000 mg | ORAL_TABLET | Freq: Every day | ORAL | 1 refills | Status: AC
  Filled 2024-07-22: qty 90, 90d supply, fill #0
  Filled 2024-10-19: qty 90, 90d supply, fill #1

## 2024-07-22 MED ORDER — ROSUVASTATIN CALCIUM 5 MG PO TABS
5.0000 mg | ORAL_TABLET | Freq: Every day | ORAL | 2 refills | Status: AC
  Filled 2024-07-22: qty 90, 90d supply, fill #0
  Filled 2024-10-19: qty 90, 90d supply, fill #1

## 2024-07-22 MED ORDER — TRAZODONE HCL 50 MG PO TABS
ORAL_TABLET | ORAL | 1 refills | Status: AC
  Filled 2024-07-22: qty 90, 90d supply, fill #0
  Filled 2024-11-24: qty 90, 90d supply, fill #1

## 2024-07-30 ENCOUNTER — Other Ambulatory Visit (HOSPITAL_COMMUNITY): Payer: Self-pay

## 2024-07-30 ENCOUNTER — Other Ambulatory Visit: Payer: Self-pay

## 2024-08-05 ENCOUNTER — Ambulatory Visit (INDEPENDENT_AMBULATORY_CARE_PROVIDER_SITE_OTHER): Payer: Self-pay | Admitting: Plastic Surgery

## 2024-08-05 DIAGNOSIS — Z719 Counseling, unspecified: Secondary | ICD-10-CM

## 2024-08-05 NOTE — Progress Notes (Signed)
 Kybella Procedure Note  Procedure: Cosmetic deoxycholic acid injection (1 vial was used)  Pre-operative Diagnosis: Submental fullness  Post-operative Diagnosis: Same  Complications:  None  Brief history: The patient desires improvement in the appearance of moderate to severe convexity or fullness associated with submental fat. I discussed with the patient this proposed procedure of Kybella, which is customized depending on the particular needs of the patient. It is performed on the submental area for reduction in the fat.  The alternatives were discussed with the patient. The risks were addressed including bleeding, scarring, infection, damage to deeper structures, asymmetry, numbness, formation of areas of hardness, swelling, nodules, skin ulceration, headache, alopecia, difficulty swallowing, and muscle weakness. Additionally, marginal mandibular nerve injury could occur and is manifested as an asymmetric smile or facial muscle weakness.  The individual's choice to undergo a surgical procedure is based on the comparison of risks to potential benefits. Injections do not arrest the aging process or produce permanent tightening of the skin.  Operative intervention maybe necessary to maintain the results. The patient understands and wishes to proceed. An informed consent was signed and informational brochures given to her prior to the procedure.  Procedure: The area was prepped with alcohol and dried with a clean gauze. Using a clean technique, the submental area was palpated and pinched.  The skin at the area was pulled and there was appropriate elasticity noted without excessive skin laxity. The treatment zone was injected in the subcutaneous layer with lidocaine 1% with epinepherine.  The pre-platysmal fat was pinched with 2 fingers.  Each area was injected perpendicular to the skin with .2 cc of Kybella using a 30 gauge needle. A cold pack was applied to the treatment area. No complications were noted.  Light pressure with an ice pack was held for 5 minutes. She was instructed explicitly in post-operative care including no massage, heavy activity, work out or facial for 24 hours.  Deoxycholic Acid LOT:  [id number]

## 2024-08-22 ENCOUNTER — Other Ambulatory Visit: Payer: Self-pay

## 2024-08-22 ENCOUNTER — Other Ambulatory Visit (HOSPITAL_COMMUNITY): Payer: Self-pay

## 2024-08-25 ENCOUNTER — Other Ambulatory Visit (HOSPITAL_COMMUNITY): Payer: Self-pay

## 2024-08-25 MED ORDER — PREMPRO 0.3-1.5 MG PO TABS
1.0000 | ORAL_TABLET | Freq: Every day | ORAL | 0 refills | Status: DC
  Filled 2024-08-25: qty 56, 56d supply, fill #0

## 2024-08-27 ENCOUNTER — Other Ambulatory Visit (HOSPITAL_COMMUNITY): Payer: Self-pay

## 2024-10-07 ENCOUNTER — Ambulatory Visit: Payer: Self-pay | Admitting: Plastic Surgery

## 2024-10-07 ENCOUNTER — Other Ambulatory Visit (HOSPITAL_BASED_OUTPATIENT_CLINIC_OR_DEPARTMENT_OTHER): Payer: Self-pay

## 2024-10-07 VITALS — BP 128/82 | HR 78

## 2024-10-07 DIAGNOSIS — Z719 Counseling, unspecified: Secondary | ICD-10-CM

## 2024-10-07 MED ORDER — COMIRNATY 30 MCG/0.3ML IM SUSY
0.3000 mL | PREFILLED_SYRINGE | Freq: Once | INTRAMUSCULAR | 0 refills | Status: AC
  Filled 2024-10-07: qty 0.3, 1d supply, fill #0

## 2024-10-07 NOTE — Progress Notes (Signed)
 The patient is a 58 year old female here for follow-up after undergoing Kybella to the neck area.  We took some pictures with the patient permission.  Those are in the chart.  Overall the patient is very pleased with her progress.  She is going to get through the holiday and then we will consider BBL for her neck and chest area.

## 2024-10-10 ENCOUNTER — Other Ambulatory Visit (HOSPITAL_BASED_OUTPATIENT_CLINIC_OR_DEPARTMENT_OTHER): Payer: Self-pay

## 2024-10-10 ENCOUNTER — Other Ambulatory Visit: Payer: Self-pay

## 2024-10-10 ENCOUNTER — Encounter: Payer: Self-pay | Admitting: Obstetrics and Gynecology

## 2024-10-10 ENCOUNTER — Ambulatory Visit: Admitting: Obstetrics and Gynecology

## 2024-10-10 VITALS — BP 137/87 | HR 80 | Ht 67.0 in | Wt 140.0 lb

## 2024-10-10 DIAGNOSIS — Z780 Asymptomatic menopausal state: Secondary | ICD-10-CM | POA: Diagnosis not present

## 2024-10-10 MED ORDER — ESTRADIOL 0.05 MG/24HR TD PTWK
0.0500 mg | MEDICATED_PATCH | TRANSDERMAL | 12 refills | Status: DC
  Filled 2024-10-10: qty 4, 28d supply, fill #0
  Filled 2024-11-01: qty 12, 84d supply, fill #1
  Filled 2024-11-04 (×2): qty 4, 28d supply, fill #1

## 2024-10-10 MED ORDER — PROGESTERONE MICRONIZED 100 MG PO CAPS
100.0000 mg | ORAL_CAPSULE | Freq: Every day | ORAL | 3 refills | Status: AC
  Filled 2024-10-10: qty 90, 90d supply, fill #0

## 2024-10-10 NOTE — Progress Notes (Signed)
 "  NEW GYNECOLOGY VISIT  Subjective:  Sonya Munoz is a 58 y.o. menopausal G0P0000 presenting for discussion of menopause hormone therapy  FMP in early 72s. Has been on PO prempro  since and would like to try a different regimen. Had tried stopping for about 1 year when she was worried about risks but did not feel well and struggled with VMS. She also is worried about her cholesterol which has been getting higher despite eating a balanced diet and getting regular exercise.   Pap due 02/2025  Past Medical History:  Diagnosis Date   Anxiety and depression    Elevated LDL cholesterol level    with high HDL - ASCVD 1.7%   GERD (gastroesophageal reflux disease)    Hypertension    Insomnia    Menopause    Past Surgical History:  Procedure Laterality Date   AUGMENTATION MAMMAPLASTY Bilateral 1998   saline    BREAST SURGERY Bilateral    augmentation   fallopian tube repair     RHINOPLASTY     SHOULDER OPEN ROTATOR CUFF REPAIR Left    Medications Ordered Prior to Encounter[1] Allergies[2] Social History   Socioeconomic History   Marital status: Divorced    Spouse name: Not on file   Number of children: 0   Years of education: Not on file   Highest education level: Not on file  Occupational History   Not on file  Tobacco Use   Smoking status: Never   Smokeless tobacco: Never  Vaping Use   Vaping status: Never Used  Substance and Sexual Activity   Alcohol use: Yes   Drug use: Not Currently    Comment: some CBD   Sexual activity: Not Currently  Other Topics Concern   Not on file  Social History Narrative   Not on file   Social Drivers of Health   Tobacco Use: Low Risk (10/10/2024)   Patient History    Smoking Tobacco Use: Never    Smokeless Tobacco Use: Never    Passive Exposure: Not on file  Financial Resource Strain: Not on file  Food Insecurity: Not on file  Transportation Needs: Not on file  Physical Activity: Not on file  Stress: Not on file  Social  Connections: Not on file  Intimate Partner Violence: Not on file  Depression (EYV7-0): Not on file  Alcohol Screen: Not on file  Housing: Not on file  Utilities: Not on file  Health Literacy: Not on file   Objective:   Vitals:   10/10/24 0954  BP: 137/87  Pulse: 80  Weight: 140 lb (63.5 kg)  Height: 5' 7 (1.702 m)   General:  Alert, oriented and cooperative. Patient is in no acute distress.  Skin: Skin is warm and dry. No rash noted.   Cardiovascular: Normal heart rate noted  Respiratory: Normal respiratory effort, no problems with respiration noted   Assessment and Plan:  Sonya Munoz is a 58 y.o. with VMS presenting for discussion of menopause hormone therapy  Menopause - Discussed optimizing current regimen for menopause hormone therapy - We prefer to use estradiol  patch due to likely lower VTE risk and well as micronized progesterone  for better cardiometabolic profile. Micronized progesterone  can also help with sleep when taken at night. This combination does not appear to affect breast cancer risk in the same way as CE+MPA - Discussed potential worsening of hot flashes until we titrate to ideal dose from her pill formulation, but that we should give each dose at least  4 weeks before re-assessing efficacy - May also have vaginal bleeding in first 6 months of therapy, but any bleeding after 6 months will require investigation - We also discussed no specific age to stop HT but that we should assess annually to see if treatment is still necessary - She would like to follow up in 1 year for annual as long as things are going well; she will send mychart message if symptoms are not adequately controlled   -     estradiol (CLIMARA) 0.05 mg/24hr patch; Place 1 patch (0.05 mg total) onto the skin once a week. -     progesterone (PROMETRIUM) 100 MG capsule; Take 1 capsule (100 mg total) by mouth daily.  Return in about 1 year (around 10/10/2025) for annual exam or sooner as  needed.  No future appointments.  Kieth JAYSON Carolin, MD     [1]  Current Outpatient Medications on File Prior to Visit  Medication Sig Dispense Refill   fluocinonide  (LIDEX ) 0.05 % external solution Apply to scalp as needed. 60 mL 0   fluocinonide  ointment (LIDEX ) 0.05 % Apply 1 Application topically as needed to elbows 15 g 1   Glucosamine-Chondroit-Vit C-Mn (GLUCOSAMINE CHONDR 500 COMPLEX PO) Take 1 capsule by mouth daily.     lisinopril  (ZESTRIL ) 10 MG tablet Take 1 tablet (10 mg total) by mouth daily. 90 tablet 3   Magnesium Oxide 400 MG CAPS Take 400 mg by mouth daily.     Multiple Vitamins-Minerals (HAIR SKIN & NAILS PO) Take 3 tablets by mouth daily.     omeprazole (PRILOSEC) 20 MG capsule Take 20 mg by mouth daily.     rosuvastatin  (CRESTOR ) 5 MG tablet Take 1 tablet (5 mg total) by mouth daily. 90 tablet 2   sertraline  (ZOLOFT ) 50 MG tablet Take 1 tablet (50 mg total) by mouth daily. 90 tablet 1   traZODone  (DESYREL ) 50 MG tablet Take 1/2-1 tablet by mouth once at bedtime as needed for sleep 90 tablet 1   No current facility-administered medications on file prior to visit.  [2] No Known Allergies  "

## 2024-10-10 NOTE — Progress Notes (Signed)
 Pt is new to office, would like to discuss HRT.  Pt states she has been on Prempro  for several years.

## 2024-10-21 ENCOUNTER — Other Ambulatory Visit: Payer: Self-pay

## 2024-10-21 ENCOUNTER — Encounter: Payer: Self-pay | Admitting: Pharmacist

## 2024-10-25 ENCOUNTER — Encounter (HOSPITAL_COMMUNITY): Payer: Self-pay

## 2024-10-25 ENCOUNTER — Other Ambulatory Visit (HOSPITAL_COMMUNITY): Payer: Self-pay

## 2024-10-27 ENCOUNTER — Other Ambulatory Visit (HOSPITAL_COMMUNITY): Payer: Self-pay

## 2024-10-27 ENCOUNTER — Other Ambulatory Visit: Payer: Self-pay

## 2024-11-03 ENCOUNTER — Other Ambulatory Visit (HOSPITAL_COMMUNITY): Payer: Self-pay

## 2024-11-03 ENCOUNTER — Other Ambulatory Visit: Payer: Self-pay

## 2024-11-04 ENCOUNTER — Other Ambulatory Visit (HOSPITAL_COMMUNITY): Payer: Self-pay

## 2024-11-04 ENCOUNTER — Other Ambulatory Visit (HOSPITAL_BASED_OUTPATIENT_CLINIC_OR_DEPARTMENT_OTHER): Payer: Self-pay

## 2024-11-04 ENCOUNTER — Other Ambulatory Visit: Payer: Self-pay

## 2024-11-04 ENCOUNTER — Encounter: Payer: Self-pay | Admitting: Obstetrics and Gynecology

## 2024-11-06 ENCOUNTER — Other Ambulatory Visit (HOSPITAL_COMMUNITY): Payer: Self-pay

## 2024-11-24 ENCOUNTER — Other Ambulatory Visit: Payer: Self-pay

## 2024-11-28 ENCOUNTER — Other Ambulatory Visit (HOSPITAL_COMMUNITY): Payer: Self-pay

## 2024-11-28 MED ORDER — ESTRADIOL 0.0375 MG/24HR TD PTTW
1.0000 | MEDICATED_PATCH | TRANSDERMAL | 12 refills | Status: AC
  Filled 2024-11-28: qty 24, 84d supply, fill #0

## 2025-01-02 ENCOUNTER — Encounter (HOSPITAL_BASED_OUTPATIENT_CLINIC_OR_DEPARTMENT_OTHER): Admitting: Radiology

## 2025-01-02 DIAGNOSIS — Z1231 Encounter for screening mammogram for malignant neoplasm of breast: Secondary | ICD-10-CM
